# Patient Record
Sex: Male | Born: 1950 | Hispanic: No | Marital: Married | Smoking: Current some day smoker
Health system: Southern US, Community
[De-identification: ages and names within clinical notes are randomized; demographics above are authoritative.]

## PROBLEM LIST (undated history)

## (undated) DIAGNOSIS — E785 Hyperlipidemia, unspecified: Secondary | ICD-10-CM

## (undated) DIAGNOSIS — M109 Gout, unspecified: Secondary | ICD-10-CM

## (undated) DIAGNOSIS — I10 Essential (primary) hypertension: Secondary | ICD-10-CM

## (undated) DIAGNOSIS — M1990 Unspecified osteoarthritis, unspecified site: Secondary | ICD-10-CM

## (undated) DIAGNOSIS — D649 Anemia, unspecified: Secondary | ICD-10-CM

## (undated) HISTORY — DX: Hyperlipidemia, unspecified: E78.5

## (undated) HISTORY — PX: CIRCUMCISION: SUR203

---
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
## 2006-09-23 ENCOUNTER — Ambulatory Visit (HOSPITAL_COMMUNITY): Admission: RE | Admit: 2006-09-23 | Discharge: 2006-09-23 | Payer: Self-pay | Admitting: Family Medicine

## 2014-10-26 ENCOUNTER — Encounter (INDEPENDENT_AMBULATORY_CARE_PROVIDER_SITE_OTHER): Payer: Self-pay

## 2014-10-26 ENCOUNTER — Ambulatory Visit (HOSPITAL_COMMUNITY)
Admission: RE | Admit: 2014-10-26 | Discharge: 2014-10-26 | Disposition: A | Discharge status: Home / Self Care | Payer: BC Managed Care – PPO | Source: Ambulatory Visit | Attending: Family Medicine | Admitting: Family Medicine

## 2014-10-26 ENCOUNTER — Other Ambulatory Visit (HOSPITAL_COMMUNITY): Payer: Self-pay | Admitting: Family Medicine

## 2014-10-26 DIAGNOSIS — W06XXXA Fall from bed, initial encounter: Secondary | ICD-10-CM | POA: Diagnosis not present

## 2014-10-26 DIAGNOSIS — W19XXXA Unspecified fall, initial encounter: Secondary | ICD-10-CM

## 2014-10-26 DIAGNOSIS — M13842 Other specified arthritis, left hand: Secondary | ICD-10-CM | POA: Insufficient documentation

## 2014-10-26 DIAGNOSIS — M25532 Pain in left wrist: Secondary | ICD-10-CM | POA: Diagnosis not present

## 2014-10-26 DIAGNOSIS — T1490XA Injury, unspecified, initial encounter: Secondary | ICD-10-CM

## 2014-10-26 DIAGNOSIS — M7989 Other specified soft tissue disorders: Secondary | ICD-10-CM | POA: Diagnosis present

## 2014-10-26 DIAGNOSIS — M79642 Pain in left hand: Secondary | ICD-10-CM | POA: Diagnosis present

## 2015-05-04 ENCOUNTER — Encounter (HOSPITAL_COMMUNITY): Payer: Self-pay | Admitting: Cardiology

## 2015-05-04 ENCOUNTER — Emergency Department (HOSPITAL_COMMUNITY): Payer: 59

## 2015-05-04 ENCOUNTER — Emergency Department (HOSPITAL_COMMUNITY)
Admission: EM | Admit: 2015-05-04 | Discharge: 2015-05-04 | Disposition: A | Discharge status: Home / Self Care | Payer: 59 | Attending: Emergency Medicine | Admitting: Emergency Medicine

## 2015-05-04 DIAGNOSIS — I10 Essential (primary) hypertension: Secondary | ICD-10-CM | POA: Insufficient documentation

## 2015-05-04 DIAGNOSIS — M545 Low back pain: Secondary | ICD-10-CM | POA: Diagnosis present

## 2015-05-04 DIAGNOSIS — M62830 Muscle spasm of back: Secondary | ICD-10-CM | POA: Diagnosis not present

## 2015-05-04 HISTORY — DX: Essential (primary) hypertension: I10

## 2015-05-04 HISTORY — DX: Gout, unspecified: M10.9

## 2015-05-04 MED ORDER — KETOROLAC TROMETHAMINE 30 MG/ML IJ SOLN
30.0000 mg | Freq: Once | INTRAMUSCULAR | Status: AC
  Administered 2015-05-04: 30 mg via INTRAVENOUS
  Filled 2015-05-04: qty 1

## 2015-05-04 MED ORDER — METHOCARBAMOL 500 MG PO TABS: 500.0000 mg | ORAL_TABLET | Freq: Two times a day (BID) | ORAL | Status: DC

## 2015-05-04 MED ORDER — DIAZEPAM 5 MG/ML IJ SOLN
5.0000 mg | Freq: Once | INTRAMUSCULAR | Status: AC
  Administered 2015-05-04: 5 mg via INTRAVENOUS
  Filled 2015-05-04: qty 2

## 2015-05-04 MED ORDER — ONDANSETRON HCL 4 MG/2ML IJ SOLN
4.0000 mg | Freq: Once | INTRAMUSCULAR | Status: AC
  Administered 2015-05-04: 4 mg via INTRAVENOUS
  Filled 2015-05-04: qty 2

## 2015-05-04 MED ORDER — MORPHINE SULFATE 4 MG/ML IJ SOLN
4.0000 mg | Freq: Once | INTRAMUSCULAR | Status: AC
  Administered 2015-05-04: 4 mg via INTRAVENOUS
  Filled 2015-05-04: qty 1

## 2015-05-04 MED ORDER — HYDROCODONE-ACETAMINOPHEN 5-325 MG PO TABS: 1.0000 | ORAL_TABLET | ORAL | Status: DC | PRN

## 2015-05-04 NOTE — Discharge Instructions (Signed)
Take Vicodin as needed for pain. Take Robaxin as needed for muscle spasm. You may take these medications together. Refer to attached documents for more information.

## 2015-05-04 NOTE — ED Notes (Signed)
Pt returned from X-ray.  

## 2015-05-04 NOTE — ED Provider Notes (Signed)
CSN: [id number]     Arrival date & time 05/04/15  1707 History  This chart was scribed for non-physician practitioner, Alvina Chou, PA-C working with Orpah Greek, MD by Evelene Croon, ED Scribe. This patient was seen in room TR07C/TR07C and the patient's care was started at 6:54 PM.    Chief Complaint  Patient presents with  . Back Pain   The history is provided by the patient. No language interpreter was used.     HPI Comments:  Dustin Brown is a 64 y.o. male with a history of back pain who presents to the Emergency Department complaining of lower back pain since 0000 this am. Pt notes pain started after a bumpy RV ride last night; states he first noticed the pain when going up the stairs.His pain is worse this am with movement of any kind  He denies acute injury.  He denies pain with palpation of the lower back, fever, diaphoresis, chills, urinary/ bowel incontinence. No alleviating factors noted.No treatments tried PTA   Past Medical History  Diagnosis Date  . Hypertension   . Gout    History reviewed. No pertinent past surgical history. History reviewed. No pertinent family history. History  Substance Use Topics  . Smoking status: Never Smoker   . Smokeless tobacco: Not on file  . Alcohol Use: Yes    Review of Systems  Musculoskeletal: Positive for back pain.  All other systems reviewed and are negative.     Allergies  Review of patient's allergies indicates no known allergies.  Home Medications   Prior to Admission medications   Not on File   BP 167/80 mmHg  Pulse 83  Temp(Src) 98.1 F (36.7 C) (Oral)  Resp 16  Ht 5\' 6"  (1.676 m)  Wt 195 lb (88.451 kg)  BMI 31.49 kg/m2  SpO2 98% Physical Exam  Constitutional: He is oriented to person, place, and time. He appears well-developed and well-nourished. No distress.  HENT:  Head: Normocephalic and atraumatic.  Eyes: Conjunctivae and EOM are normal.  Neck: Normal range of motion.  Cardiovascular:  Normal rate and regular rhythm.  Exam reveals no gallop and no friction rub.   No murmur heard. Pulmonary/Chest: Effort normal and breath sounds normal. He has no wheezes. He has no rales. He exhibits no tenderness.  Abdominal: Soft. He exhibits no distension. There is no tenderness.  Musculoskeletal: Normal range of motion.  Lower lumbar and sacral tenderness to palpation with associated paraspinal tenderness to palpation.   Neurological: He is alert and oriented to person, place, and time.  Speech is goal-oriented. Moves limbs without ataxia.   Skin: Skin is warm and dry.  Psychiatric: He has a normal mood and affect. His behavior is normal.  Nursing note and vitals reviewed.   ED Course  Procedures   DIAGNOSTIC STUDIES:  Oxygen Saturation is 98% on RA, normal by my interpretation.    COORDINATION OF CARE:  7:51 PM Pt updated with results. Discussed treatment plan with pt at bedside and pt agreed to plan.  Labs Review Labs Reviewed - No data to display  Imaging Review Dg Lumbar Spine Complete  05/04/2015   CLINICAL DATA:  Bumpy ride in an RV yesterday.  Low back pain.  EXAM: LUMBAR SPINE - COMPLETE 4+ VIEW  COMPARISON:  None.  FINDINGS: There is no evidence of lumbar spine fracture. Alignment is normal. Chronic disc space narrowing L4-5 and L5-S1. Prominent anterior osteophytes.  IMPRESSION: Negative. Please see additional findings on sacrum and coccyx report.  Electronically Signed   By: Rolla Flatten M.D.   On: 05/04/2015 19:25   Dg Sacrum/coccyx  05/04/2015   CLINICAL DATA:  Sacral pain.  EXAM: SACRUM AND COCCYX - 2+ VIEW  COMPARISON:  None.  FINDINGS: No fracture or bony lesion is identified. Sacroiliac joints have a normal and symmetric appearance. The coccyx shows normal alignment. Other visualized portions of the bony pelvis are unremarkable.  IMPRESSION: No evidence of sacral or coccygeal fracture.   Electronically Signed   By: Aletta Edouard M.D.   On: 05/04/2015 19:27      EKG Interpretation None      MDM   Final diagnoses:  Spasm of lumbar paraspinous muscle    Patient reports improvement of symptoms. Patient's xrays unremarkable for acute changes. Patient likely having severe muscle spasm. No bladder/bowel incontinence or saddle paresthesias.   I personally performed the services described in this documentation, which was scribed in my presence. The recorded information has been reviewed and is accurate.    Alvina Chou, PA-C 05/04/15 [place], MD 05/05/15 [phone]

## 2015-05-04 NOTE — ED Notes (Signed)
Pt reports that he was in an RV last night on the way home and it bumpy. Reports he has been having lower back pain since he woke up this morning.

## 2016-01-23 ENCOUNTER — Other Ambulatory Visit: Payer: Self-pay | Admitting: Urology

## 2016-01-23 DIAGNOSIS — R9349 Abnormal radiologic findings on diagnostic imaging of other urinary organs: Secondary | ICD-10-CM

## 2016-02-05 ENCOUNTER — Ambulatory Visit (HOSPITAL_COMMUNITY)
Admission: RE | Admit: 2016-02-05 | Discharge: 2016-02-05 | Disposition: A | Discharge status: Home / Self Care | Payer: BLUE CROSS/BLUE SHIELD | Source: Ambulatory Visit | Attending: Urology | Admitting: Urology

## 2016-02-05 DIAGNOSIS — N289 Disorder of kidney and ureter, unspecified: Secondary | ICD-10-CM | POA: Diagnosis not present

## 2016-02-05 DIAGNOSIS — K5730 Diverticulosis of large intestine without perforation or abscess without bleeding: Secondary | ICD-10-CM | POA: Insufficient documentation

## 2016-02-05 DIAGNOSIS — R9349 Abnormal radiologic findings on diagnostic imaging of other urinary organs: Secondary | ICD-10-CM

## 2016-02-05 LAB — POCT I-STAT CREATININE: Creatinine, Ser: 0.9 mg/dL (ref 0.61–1.24)

## 2016-02-05 MED ORDER — GADOBENATE DIMEGLUMINE 529 MG/ML IV SOLN
20.0000 mL | Freq: Once | INTRAVENOUS | Status: AC | PRN
  Administered 2016-02-05: 18 mL via INTRAVENOUS

## 2017-08-08 ENCOUNTER — Other Ambulatory Visit: Payer: Self-pay | Admitting: Urology

## 2017-08-08 DIAGNOSIS — D49519 Neoplasm of unspecified behavior of unspecified kidney: Secondary | ICD-10-CM

## 2017-09-15 ENCOUNTER — Ambulatory Visit (HOSPITAL_COMMUNITY)
Admission: RE | Admit: 2017-09-15 | Discharge: 2017-09-15 | Disposition: A | Discharge status: Home / Self Care | Payer: BLUE CROSS/BLUE SHIELD | Source: Ambulatory Visit | Attending: Urology | Admitting: Urology

## 2017-09-15 DIAGNOSIS — Q6102 Congenital multiple renal cysts: Secondary | ICD-10-CM | POA: Insufficient documentation

## 2017-09-15 DIAGNOSIS — D3000 Benign neoplasm of unspecified kidney: Secondary | ICD-10-CM | POA: Insufficient documentation

## 2017-09-15 DIAGNOSIS — D49519 Neoplasm of unspecified behavior of unspecified kidney: Secondary | ICD-10-CM

## 2017-09-15 LAB — POCT I-STAT CREATININE: CREATININE: 0.9 mg/dL (ref 0.61–1.24)

## 2017-09-15 MED ORDER — GADOBENATE DIMEGLUMINE 529 MG/ML IV SOLN
20.0000 mL | Freq: Once | INTRAVENOUS | Status: AC | PRN
  Administered 2017-09-15: 18 mL via INTRAVENOUS

## 2018-06-03 DIAGNOSIS — Z6830 Body mass index (BMI) 30.0-30.9, adult: Secondary | ICD-10-CM | POA: Diagnosis not present

## 2018-06-03 DIAGNOSIS — M1000 Idiopathic gout, unspecified site: Secondary | ICD-10-CM | POA: Diagnosis not present

## 2018-06-03 DIAGNOSIS — M130 Polyarthritis, unspecified: Secondary | ICD-10-CM | POA: Diagnosis not present

## 2018-06-18 DIAGNOSIS — R9431 Abnormal electrocardiogram [ECG] [EKG]: Secondary | ICD-10-CM | POA: Diagnosis not present

## 2018-06-18 DIAGNOSIS — I10 Essential (primary) hypertension: Secondary | ICD-10-CM | POA: Diagnosis not present

## 2018-06-22 DIAGNOSIS — R0683 Snoring: Secondary | ICD-10-CM | POA: Diagnosis not present

## 2018-06-22 DIAGNOSIS — R9431 Abnormal electrocardiogram [ECG] [EKG]: Secondary | ICD-10-CM | POA: Diagnosis not present

## 2018-06-22 DIAGNOSIS — Z8249 Family history of ischemic heart disease and other diseases of the circulatory system: Secondary | ICD-10-CM | POA: Diagnosis not present

## 2018-06-22 DIAGNOSIS — I119 Hypertensive heart disease without heart failure: Secondary | ICD-10-CM | POA: Diagnosis not present

## 2018-06-22 DIAGNOSIS — M1000 Idiopathic gout, unspecified site: Secondary | ICD-10-CM | POA: Diagnosis not present

## 2018-06-22 DIAGNOSIS — I38 Endocarditis, valve unspecified: Secondary | ICD-10-CM | POA: Diagnosis not present

## 2018-06-22 DIAGNOSIS — Z79899 Other long term (current) drug therapy: Secondary | ICD-10-CM | POA: Diagnosis not present

## 2018-06-22 DIAGNOSIS — I5189 Other ill-defined heart diseases: Secondary | ICD-10-CM | POA: Diagnosis not present

## 2018-06-22 DIAGNOSIS — R972 Elevated prostate specific antigen [PSA]: Secondary | ICD-10-CM | POA: Diagnosis not present

## 2018-06-22 DIAGNOSIS — I10 Essential (primary) hypertension: Secondary | ICD-10-CM | POA: Diagnosis not present

## 2018-07-21 DIAGNOSIS — M1000 Idiopathic gout, unspecified site: Secondary | ICD-10-CM | POA: Diagnosis not present

## 2018-07-21 DIAGNOSIS — I10 Essential (primary) hypertension: Secondary | ICD-10-CM | POA: Diagnosis not present

## 2018-07-21 DIAGNOSIS — E782 Mixed hyperlipidemia: Secondary | ICD-10-CM | POA: Diagnosis not present

## 2018-07-23 ENCOUNTER — Ambulatory Visit (INDEPENDENT_AMBULATORY_CARE_PROVIDER_SITE_OTHER): Payer: Medicare Other | Admitting: Neurology

## 2018-07-23 ENCOUNTER — Encounter: Payer: Self-pay | Admitting: Neurology

## 2018-07-23 VITALS — BP 154/82 | HR 61 | Ht 66.0 in | Wt 188.0 lb

## 2018-07-23 DIAGNOSIS — G4719 Other hypersomnia: Secondary | ICD-10-CM | POA: Diagnosis not present

## 2018-07-23 DIAGNOSIS — R0683 Snoring: Secondary | ICD-10-CM

## 2018-07-23 DIAGNOSIS — E669 Obesity, unspecified: Secondary | ICD-10-CM

## 2018-07-23 DIAGNOSIS — R011 Cardiac murmur, unspecified: Secondary | ICD-10-CM | POA: Diagnosis not present

## 2018-07-23 DIAGNOSIS — R9431 Abnormal electrocardiogram [ECG] [EKG]: Secondary | ICD-10-CM | POA: Diagnosis not present

## 2018-07-23 NOTE — Patient Instructions (Signed)

## 2018-07-23 NOTE — Progress Notes (Signed)
Subjective:    Patient ID: Dustin Brown is a 67 y.o. male.  HPI     Star Age, MD, PhD [facility] [address], [address] [place], [place] [address]  Dear Dr. Virgina Jock,   I saw your patient, Dustin Brown, upon your kind request, in my neurologic clinic today for initial consultation of his sleep disorder, in particular, concern for underlying obstructive sleep apnea. The patient is unaccompanied today. As you know, Dustin Brown is a 67 year old right-handed gentleman with an underlying medical history of hypertension, hyperlipidemia, gout, low testosterone, smoking, iron deficiency, and obesity, who reports snoring and excessive daytime somnolence. I reviewed your office note from 05/20/2018, which you kindly included. He had evidence of EKG abnormalities. He was scheduled for further workup including echocardiogram. His Epworth sleepiness score is 10 out of 24 today, fatigue score is 17 out of 63. He is married and lives with his wife, they have 3 children. He works as a Art gallery manager. He smokes cigars occasionally. He drinks alcohol on a regular basis, in the form of Brandy, 4-5 per night. He drinks caffeine in the form of coffee or tea occasionally. His bedtime is generally between 10 and 10:30, rise time between 6 and 6:30 AM. He does not have a night to night nocturia and denies morning headaches. Generally he wakes up fairly well rested, sometimes he is tired during the day and may take a nap. He has never fallen asleep at the wheel. His snoring sometimes bothers his wife. He is not aware of any family history of OSA. He would be willing to try CPAP if needed. He reports that his echocardiogram was fine. He smokes a cigar a week on average. He does not have a history of lung disease or asthma in the past.   His Past Medical History Is Significant For: Past Medical History:  Diagnosis Date  . Gout   . Hyperlipidemia   . Hypertension     His Past Surgical  History Is Significant For: No past surgical history on file.  His Family History Is Significant For: Family History  Problem Relation Age of Onset  . Heart attack Father     His Social History Is Significant For: Social History   Socioeconomic History  . Marital status: Married    Spouse name: Not on file  . Number of children: Not on file  . Years of education: Not on file  . Highest education level: Not on file  Occupational History  . Not on file  Social Needs  . Financial resource strain: Not on file  . Food insecurity:    Worry: Not on file    Inability: Not on file  . Transportation needs:    Medical: Not on file    Non-medical: Not on file  Tobacco Use  . Smoking status: Current Some Day Smoker  . Smokeless tobacco: Never Used  Substance and Sexual Activity  . Alcohol use: Yes  . Drug use: Not on file  . Sexual activity: Not on file  Lifestyle  . Physical activity:    Days per week: Not on file    Minutes per session: Not on file  . Stress: Not on file  Relationships  . Social connections:    Talks on phone: Not on file    Gets together: Not on file    Attends religious service: Not on file    Active member of club or organization: Not on file    Attends  meetings of clubs or organizations: Not on file    Relationship status: Not on file  Other Topics Concern  . Not on file  Social History Narrative  . Not on file    His Allergies Are:  No Known Allergies:   His Current Medications Are:  Outpatient Encounter Medications as of 07/23/2018  Medication Sig  . allopurinol (ZYLOPRIM) 100 MG tablet Take 100 mg by mouth daily.  Marland Kitchen amLODipine (NORVASC) 5 MG tablet Take 5 mg by mouth daily.  Marland Kitchen aspirin EC 81 MG tablet Take 81 mg by mouth daily.  . Cholecalciferol (VITAMIN D3 PO) Take by mouth.  . Ferrous Sulfate (IRON) 325 (65 Fe) MG TABS Take by mouth.  . hydrochlorothiazide (HYDRODIURIL) 25 MG tablet Take 25 mg by mouth daily.  . Latanoprostene Bunod  (VYZULTA) 0.024 % SOLN Apply to eye.  . rosuvastatin (CRESTOR) 20 MG tablet Take 20 mg by mouth daily.  . Testosterone 20.25 MG/1.25GM (1.62%) GEL Place onto the skin.  . verapamil (CALAN-SR) 240 MG CR tablet Take 240 mg by mouth 2 (two) times daily.  . [DISCONTINUED] HYDROcodone-acetaminophen (NORCO/VICODIN) 5-325 MG per tablet Take 1-2 tablets by mouth every 4 (four) hours as needed.  . [DISCONTINUED] methocarbamol (ROBAXIN) 500 MG tablet Take 1 tablet (500 mg total) by mouth 2 (two) times daily.   No facility-administered encounter medications on file as of 07/23/2018.   :  Review of Systems:  Out of a complete 14 point review of systems, all are reviewed and negative with the exception of these symptoms as listed below:  Review of Systems  Neurological:       Pt presents today to discuss his sleep. Pt has never had a sleep study but does endorse snoring.  Epworth Sleepiness Scale 0= would never doze 1= slight chance of dozing 2= moderate chance of dozing 3= high chance of dozing  Sitting and reading: 2 Watching TV: 2 Sitting inactive in a public place (ex. Theater or meeting): 2 As a passenger in a car for an hour without a break: 1 Lying down to rest in the afternoon: 3 Sitting and talking to someone: 0 Sitting quietly after lunch (no alcohol): 0 In a car, while stopped in traffic: 0 Total: 10     Objective:  Neurological Exam  Physical Exam Physical Examination:   Vitals:   07/23/18 0850  BP: (!) 154/82  Pulse: 61   General Examination: The patient is a very pleasant 67 y.o. male in no acute distress. He appears well-developed and well-nourished and well groomed.   HEENT: Normocephalic, atraumatic, pupils are equal, round and reactive to light and accommodation. Funduscopic exam is not possible, has fairly dense cataracts. He has corrective eyeglasses in place. He has bilateral lazy eye, right eye seems dominant. Extraocular tracking is good without limitation to  gaze excursion or nystagmus noted. Normal smooth pursuit is noted. Hearing is grossly intact. Face is symmetric with normal facial animation and normal facial sensation. Speech is clear with no dysarthria noted. There is no hypophonia. There is no lip, neck/head, jaw or voice tremor. Neck is supple with full range of passive and active motion. There are no carotid bruits on auscultation. Oropharynx exam reveals: mild mouth dryness, adequate dental hygiene and full dentures in place and fairly significant airway crowding secondary to thicker soft palate, larger tongue, redundant soft palate tip of uvula was not fully visualized and tonsils of 1-2+ bilaterally. Mallampati is class III. Neck circumference is 16-1/4 inches.  Chest: Clear  to auscultation without wheezing, rhonchi or crackles noted.  Heart: S1+S2+0, regular and normal with a 2/6 syst murmur, no rubs or gallops noted.   Abdomen: Soft, non-tender and non-distended with normal bowel sounds appreciated on auscultation.  Extremities: There is no pitting edema in the distal lower extremities bilaterally. Pedal pulses are intact.  Skin: Warm and dry without trophic changes noted.  Musculoskeletal: exam reveals no obvious joint deformities, tenderness or joint swelling or erythema.   Neurologically:  Mental status: The patient is awake, alert and oriented in all 4 spheres. His immediate and remote memory, attention, language skills and fund of knowledge are appropriate. There is no evidence of aphasia, agnosia, apraxia or anomia. Speech is clear with normal prosody and enunciation. Thought process is linear. Mood is normal and affect is normal.  Cranial nerves II - XII are as described above under HEENT exam. In addition: shoulder shrug is normal with equal shoulder height noted. Motor exam: Normal bulk, strength and tone is noted. There is no drift, tremor or rebound. Romberg is negative. Reflexes are 1+ throughout. Fine motor skills and  coordination: intact with normal finger taps, normal hand movements, normal rapid alternating patting, normal foot taps and normal foot agility.  Cerebellar testing: No dysmetria or intention tremor. There is no truncal or gait ataxia.  Sensory exam: intact to light touch in the upper and lower extremities.  Gait, station and balance: He stands easily. No veering to one side is noted. No leaning to one side is noted. Posture is age-appropriateAnd he stands naturally slightly wider based. Tandem walk is not possible for him. Gait is otherwise unremarkable with preserved arm swing.  Assessment and Plan:   In summary, Quention Mcneill is a very pleasant 67 y.o.-year old male with an underlying medical history of hypertension, hyperlipidemia, gout, low testosterone, smoking, iron deficiency, and obesity, whose history and particularly, his physical exam are rightconcerning for obstructive sleep apnea (OSA). I had a long chat with the patient about my findings and the diagnosis of OSA, its prognosis and treatment options. We talked about medical treatments, surgical interventions and non-pharmacological approaches. I explained in particular the risks and ramifications of untreated moderate to severe OSA, especially with respect to developing cardiovascular disease down the Road, including congestive heart failure, difficult to treat hypertension, cardiac arrhythmias, or stroke. Even type 2 diabetes has, in part, been linked to untreated OSA. Symptoms of untreated OSA include daytime sleepiness, memory problems, mood irritability and mood disorder such as depression and anxiety, lack of energy, as well as recurrent headaches, especially morning headaches. We talked about smoking cessation and trying to maintain a healthy lifestyle in general, as well as the importance of weight control. I encouraged the patient to eat healthy, exercise daily and keep well hydrated, to keep a scheduled bedtime and wake time routine, to  not skip any meals and eat healthy snacks in between meals. I advised the patient not to drive when feeling sleepy. I recommended the following at this time: sleep study with potential positive airway pressure titration. (We will score hypopneas at 4%).   I explained the sleep test procedure to the patient and also outlined possible treatment options including the use of CPAP therapy. He indicated that he would be willing to try CPAP if the need arises.  I answered all his questions today and the patient was in agreement. I would like to see him back after the sleep study is completed and encouraged him to call with any interim  questions, concerns, problems or updates.   Thank you very much for allowing me to participate in the care of this nice patient. If I can be of any further assistance to you please do not hesitate to call me at [phone].  Sincerely,   Star Age, MD, PhD

## 2018-08-02 ENCOUNTER — Ambulatory Visit (INDEPENDENT_AMBULATORY_CARE_PROVIDER_SITE_OTHER): Payer: Medicare Other | Admitting: Neurology

## 2018-08-02 DIAGNOSIS — G4734 Idiopathic sleep related nonobstructive alveolar hypoventilation: Secondary | ICD-10-CM

## 2018-08-02 DIAGNOSIS — R011 Cardiac murmur, unspecified: Secondary | ICD-10-CM

## 2018-08-02 DIAGNOSIS — G4719 Other hypersomnia: Secondary | ICD-10-CM

## 2018-08-02 DIAGNOSIS — G4733 Obstructive sleep apnea (adult) (pediatric): Secondary | ICD-10-CM | POA: Diagnosis not present

## 2018-08-02 DIAGNOSIS — R0683 Snoring: Secondary | ICD-10-CM

## 2018-08-02 DIAGNOSIS — G4720 Circadian rhythm sleep disorder, unspecified type: Secondary | ICD-10-CM

## 2018-08-02 DIAGNOSIS — R9431 Abnormal electrocardiogram [ECG] [EKG]: Secondary | ICD-10-CM

## 2018-08-02 DIAGNOSIS — E669 Obesity, unspecified: Secondary | ICD-10-CM

## 2018-08-04 ENCOUNTER — Telehealth: Payer: Self-pay

## 2018-08-04 NOTE — Telephone Encounter (Signed)
I called pt. I advised pt that Dr. Rexene Alberts reviewed their sleep study results and found that pt has severe osa with low oxygen saturations and recommends that pt be treated with a cpap. Dr. Rexene Alberts recommends that pt return for a repeat sleep study in order to properly titrate the cpap and ensure a good mask fit. Pt is agreeable to returning for a titration study. I advised pt that our sleep lab will file with pt's insurance and call pt to schedule the sleep study when we hear back from the pt's insurance regarding coverage of this sleep study. I recommended that pt lose some weight and stop smoking altogether. Pt verbalized understanding of results. Pt had no questions at this time but was encouraged to call back if questions arise.

## 2018-08-04 NOTE — Procedures (Signed)
PATIENT'S NAME:  Dustin Brown, Dustin Brown DOB:      05-Jan-1951      MR#:    [id number]     DATE OF RECORDING: 08/02/2018 REFERRING M.D.:  Vernell Leep, MD Study Performed:   Baseline Polysomnogram HISTORY: 67 year old right-handed gentleman with an underlying medical history of hypertension, hyperlipidemia, gout, low testosterone, smoking, iron deficiency, and obesity, who reports snoring and excessive daytime somnolence. He had evidence of EKG abnormalities. The patient endorsed the Epworth Sleepiness Scale at 10/24 points. The patient's weight 188 pounds with a height of 66 (inches), resulting in a BMI of 30.1 kg/m2. The patient's neck circumference measured 16.2 inches.  CURRENT MEDICATIONS: Allopurinol, Amlodipine, Aspirin, Ferrous Sulfate, Hydrochlorothiazide, Latanoprostene Bunod, Rosuvastain, Testosterone and Verapamil.   PROCEDURE:  This is a multichannel digital polysomnogram utilizing the Somnostar 11.2 system.  Electrodes and sensors were applied and monitored per AASM Specifications.   EEG, EOG, Chin and Limb EMG, were sampled at 200 Hz.  ECG, Snore and Nasal Pressure, Thermal Airflow, Respiratory Effort, CPAP Flow and Pressure, Oximetry was sampled at 50 Hz. Digital video and audio were recorded.      BASELINE STUDY  Lights Out was at 21:56 and Lights On at 05:13.  Total recording time (TRT) was 437.5 minutes, with a total sleep time (TST) of  329 minutes.   The patient's sleep latency was 3 minutes. REM latency was 136 minutes, which is mildly delayed. The sleep efficiency was 75.2%.      SLEEP ARCHITECTURE: WASO (Wake after sleep onset) was 104.5 minutes with one long period of wakefulness and otherwise no significant sleep fragmentation noted. There were 13.5 minutes in Stage N1, 263.5 minutes Stage N2, 0 minutes Stage N3 and 52 minutes in Stage REM. The percentage of Stage N1 was 4.1%, Stage N2 was 80.1%, which is markedly increased, stage N3 was absent and Stage R (REM sleep) was 15.8%, which  is mildly reduced. The arousals were noted as: 20 were spontaneous, 0 were associated with PLMs, 200 were associated with respiratory events.  RESPIRATORY ANALYSIS:  There were a total of 202 respiratory events:  62 obstructive apneas, 0 central apneas and 0 mixed apneas with a total of 62 apneas and an apnea index (AI) of 11.3 /hour. There were 140 hypopneas with a hypopnea index of 25.5 /hour. The patient also had 0 respiratory event related arousals (RERAs).      The total APNEA/HYPOPNEA INDEX (AHI) was 36.8/hour and the total RESPIRATORY DISTURBANCE INDEX was  36.8 /hour.  63 events occurred in REM sleep and 218 events in NREM. The REM AHI was 72.7 /hour, versus a non-REM AHI of 30.1. The patient spent 72 minutes of total sleep time in the supine position and 257 minutes in non-supine.. The supine AHI was 45.9 versus a non-supine AHI of 34.3.  OXYGEN SATURATION & C02:  The Wake baseline 02 saturation was 94%, with the lowest being 74%. Time spent below 89% saturation equaled 246 minutes.  PERIODIC LIMB MOVEMENTS: The patient had a total of 0 Periodic Limb Movements.  The Periodic Limb Movement (PLM) index was 0 and the PLM Arousal index was 0/hour.  Audio and video analysis did not show any abnormal or unusual movements, behaviors, phonations or vocalizations. The patient took on bathroom break. Mild snoring was noted. The EKG was in keeping with normal sinus rhythm (NSR).  Post-study, the patient indicated that sleep was the same as usual.   IMPRESSION: 1. Obstructive Sleep Apnea (OSA)\ 2. Nocturnal hypoxemia 3. Dysfunctions associated  with sleep stages or arousal from sleep  RECOMMENDATIONS: 1. This study demonstrates severe obstructive sleep apnea, with a total AHI of 36.8/hour, REM AHI of 72.74/hour, supine AHI of 45.9/hour and O2 nadir of 74%. His asleep average oxygen saturation was less than 90% indicating nocturnal hypoxia with significant time below or at 88% saturation of over 4  hours for the night. Treatment with positive airway pressure in the form of CPAP is recommended. This will require a full night titration study to optimize therapy. He may need further cardiopulmonary workup for low oxygen saturations during sleep. Weight loss and (cigar) smoking cessation should be promoted. Other treatment options for OSA may include avoidance of supine sleep position along with weight loss, upper airway or jaw surgery in selected patients or the use of an oral appliance in certain patients. ENT evaluation and/or consultation with a maxillofacial surgeon or dentist may be feasible in some instances. Please note that untreated obstructive sleep apnea may carry additional perioperative morbidity. Patients with significant obstructive sleep apnea should receive perioperative PAP therapy and the surgeons and particularly the anesthesiologist should be informed of the diagnosis and the severity of the sleep disordered breathing. 2. This study shows abnormal sleep stage percentages; these are nonspecific findings and per se do not signify an intrinsic sleep disorder or a cause for the patient's sleep-related symptoms. Causes include (but are not limited to) the first night effect of the sleep study, circadian rhythm disturbances, medication effect or an underlying mood disorder or medical problem.  3. The patient should be cautioned not to drive, work at heights, or operate dangerous or heavy equipment when tired or sleepy. Review and reiteration of good sleep hygiene measures should be pursued with any patient. 4. The patient will be seen in follow-up in the sleep clinic at [facility] for discussion of the test results, symptom and treatment compliance review, further management strategies, etc. The referring provider will be notified of the test results.  I certify that I have reviewed the entire raw data recording prior to the issuance of this report in accordance with the Standards of Accreditation of  the American Academy of Sleep Medicine (AASM)  Star Age, MD, PhD Diplomat, American Board of Neurology and Sleep Medicine (Neurology and Sleep Medicine)

## 2018-08-04 NOTE — Telephone Encounter (Signed)
-----   Message from Star Age, MD sent at 08/04/2018  8:37 AM EDT ----- Patient referred by Dr. Virgina Jock, seen by me on 07/23/18, diagnostic PSG on 08/02/18.   Please call and notify the patient that the recent sleep study showed severe obstructive sleep apnea, with a total AHI of 36.8/hour, REM AHI of 72.74/hour, supine AHI of 45.9/hour and O2 nadir of 74%. His asleep average oxygen saturation was less than 90% indicating nocturnal hypoxia with significant time below or at 88% saturation of over 4 hours for the night. I recommend treatment for this in the form of CPAP. This will require a repeat sleep study for proper titration and mask fitting and correct monitoring of the oxygen saturations. Furthermore, I encourage pt to lose weight and quit (cigar) smoking altogether. Please explain to patient. I have placed an order in the chart. Thanks.  Star Age, MD, PhD [facility])

## 2018-08-04 NOTE — Progress Notes (Signed)
Patient referred by Dr. Virgina Jock, seen by me on 07/23/18, diagnostic PSG on 08/02/18.   Please call and notify the patient that the recent sleep study showed severe obstructive sleep apnea, with a total AHI of 36.8/hour, REM AHI of 72.74/hour, supine AHI of 45.9/hour and O2 nadir of 74%. His asleep average oxygen saturation was less than 90% indicating nocturnal hypoxia with significant time below or at 88% saturation of over 4 hours for the night. I recommend treatment for this in the form of CPAP. This will require a repeat sleep study for proper titration and mask fitting and correct monitoring of the oxygen saturations. Furthermore, I encourage pt to lose weight and quit (cigar) smoking altogether. Please explain to patient. I have placed an order in the chart. Thanks.  Star Age, MD, PhD [facility])

## 2018-08-04 NOTE — Addendum Note (Signed)
Addended by: Star Age on: 08/04/2018 08:37 AM   Modules accepted: Orders

## 2018-08-16 ENCOUNTER — Ambulatory Visit (INDEPENDENT_AMBULATORY_CARE_PROVIDER_SITE_OTHER): Payer: Medicare Other | Admitting: Neurology

## 2018-08-16 DIAGNOSIS — G4720 Circadian rhythm sleep disorder, unspecified type: Secondary | ICD-10-CM

## 2018-08-16 DIAGNOSIS — G4733 Obstructive sleep apnea (adult) (pediatric): Secondary | ICD-10-CM | POA: Diagnosis not present

## 2018-08-16 DIAGNOSIS — R9431 Abnormal electrocardiogram [ECG] [EKG]: Secondary | ICD-10-CM

## 2018-08-16 DIAGNOSIS — E669 Obesity, unspecified: Secondary | ICD-10-CM

## 2018-08-16 DIAGNOSIS — G4734 Idiopathic sleep related nonobstructive alveolar hypoventilation: Secondary | ICD-10-CM

## 2018-08-16 DIAGNOSIS — G4719 Other hypersomnia: Secondary | ICD-10-CM

## 2018-08-16 DIAGNOSIS — R011 Cardiac murmur, unspecified: Secondary | ICD-10-CM

## 2018-08-19 ENCOUNTER — Telehealth: Payer: Self-pay

## 2018-08-19 NOTE — Telephone Encounter (Signed)
I called pt. I advised pt that Dr. Rexene Alberts reviewed their sleep study results and found that pt did well during his latest sleep study with the cpap. Dr. Rexene Alberts recommends that pt start a cpap at home. I reviewed PAP compliance expectations with the pt. Pt is agreeable to starting a CPAP. I advised pt that an order will be sent to a DME, [facility], and [facility] will call the pt within about one week after they file with the pt's insurance. [facility] will show the pt how to use the machine, fit for masks, and troubleshoot the CPAP if needed. A follow up appt was made for insurance purposes with Dr. Rexene Alberts on 11/09/2018 at 8:30am. Pt verbalized understanding to arrive 15 minutes early and bring their CPAP. A letter with all of this information in it will be mailed to the pt as a reminder. I verified with the pt that the address we have on file is correct. Pt verbalized understanding of results. Pt had no questions at this time but was encouraged to call back if questions arise.

## 2018-08-19 NOTE — Procedures (Signed)
PATIENT'S NAME:  Dustin Brown, Dustin Brown DOB:      05-14-1951      MR#:    [id number]     DATE OF RECORDING: 08/16/2018 REFERRING M.D.:  Vernell Leep, MD Study Performed:   CPAP  Titration HISTORY: 67 year old right-handed gentleman with an underlying medical history of hypertension, hyperlipidemia, gout, low testosterone, smoking, iron deficiency, and obesity, who presents for a full night titration study. His baseline PSG on 08/02/18 showed severe obstructive sleep apnea, with a total AHI of 36.8/hour, REM AHI of 72.74/hour, supine AHI of 45.9/hour and O2 nadir of 74%. His asleep average oxygen saturation was less than 90% indicating nocturnal hypoxia with significant time below or at 88% saturation of over 4 hours for the night. The patient's weight 187 pounds with a height of 66 (inches), resulting in a BMI of 30.1 kg/m2. The patient's neck circumference measured 16 inches.  CURRENT MEDICATIONS: Allopurinol, Amlodipine, Aspirin, Ferrous Sulfate, Hydrochlorothiazide, Latanoprostene Bunod, Rosuvastain, Testosterone and Verapamil.  PROCEDURE:  This is a multichannel digital polysomnogram utilizing the SomnoStar 11.2 system.  Electrodes and sensors were applied and monitored per AASM Specifications.   EEG, EOG, Chin and Limb EMG, were sampled at 200 Hz.  ECG, Snore and Nasal Pressure, Thermal Airflow, Respiratory Effort, CPAP Flow and Pressure, Oximetry was sampled at 50 Hz. Digital video and audio were recorded.      The patient was fitted with a MW Dreamwear FFM. CPAP was initiated at 5 cmH20 with heated humidity per AASM standards and pressure was advanced to 12 cmH20 because of hypopneas, apneas and desaturations.  At a PAP pressure of 12 cmH20, there was a reduction of the AHI to 0/hour with brief supine REM sleep achieved and O2 nadir of 89%.  Lights Out was at 22:00 and Lights On at 05:01. Total recording time (TRT) was 421.5 minutes, with a total sleep time (TST) of 304 minutes. The patient's sleep  latency was 15.5 minutes. REM latency was 374.5 minutes, which is markedly delayed. The sleep efficiency was 72.1 %, which is reduced.    SLEEP ARCHITECTURE: WASO (Wake after sleep onset) was 93.5 minutes with moderate sleep fragmentation noted.  There were 17.5 minutes in Stage N1, 264.5 minutes Stage N2, 0 minutes Stage N3 and 22 minutes in Stage REM.  The percentage of Stage N1 was 5.8%, Stage N2 was 87.%, Stage N3 was absent, and Stage R (REM sleep) was 7.2%, which is reduced. The arousals were noted as: 21 were spontaneous, 0 were associated with PLMs, 68 were associated with respiratory events.  RESPIRATORY ANALYSIS:  There was a total of 70 respiratory events: 0 obstructive apneas, 0 central apneas and 0 mixed apneas with a total of 0 apneas and an apnea index (AI) of 0 /hour. There were 70 hypopneas with a hypopnea index of 13.8/hour. The patient also had 0 respiratory event related arousals (RERAs).      The total APNEA/HYPOPNEA INDEX  (AHI) was 13.8 /hour and the total RESPIRATORY DISTURBANCE INDEX was 13.8 /hour  0 events occurred in REM sleep and 70 events in NREM. The REM AHI was 0 /hour versus a non-REM AHI of 14.9 /hour.  The patient spent 217.5 minutes of total sleep time in the supine position and 87 minutes in non-supine. The supine AHI was 15.7, versus a non-supine AHI of 9.0.  OXYGEN SATURATION & C02:  The baseline 02 saturation was 96%, with the lowest being 84%. Time spent below 89% saturation equaled 33 minutes.  PERIODIC LIMB MOVEMENTS:  The patient had a total of 0 Periodic Limb Movements. The Periodic Limb Movement (PLM) index was 0 and the PLM Arousal index was 0 /hour.  Audio and video analysis did not show any abnormal or unusual movements, behaviors, phonations or vocalizations. The patient took no bathroom breaks. The EKG was in keeping with normal sinus rhythm (NSR). Post-study, the patient indicated that sleep was better than usual.   IMPRESSION:  1. Obstructive  Sleep Apnea (OSA)\ 2. Dysfunctions associated with sleep stages or arousal from sleep   RECOMMENDATIONS:  1. This study demonstrates resolution of the patient's obstructive sleep apnea with CPAP therapy. I will recommend a home CPAP pressure of 13 cm, due to O2 nadir in supine REM sleep of 89% on the final titration pressure of 12 cm. The patient should be reminded to be fully compliant with PAP therapy to improve sleep related symptoms and decrease long term cardiovascular risks. The patient should be reminded, that it may take up to 3 months to get fully used to using PAP with all planned sleep. The earlier full compliance is achieved, the better long term compliance tends to be. Please note that untreated obstructive sleep apnea may carry additional perioperative morbidity. Patients with significant obstructive sleep apnea should receive perioperative PAP therapy and the surgeons and particularly the anesthesiologist should be informed of the diagnosis and the severity of the sleep disordered breathing. Weight loss and complete smoking cessation should be encouraged.  2. This study shows sleep fragmentation and abnormal sleep stage percentages; these are nonspecific findings and per se do not signify an intrinsic sleep disorder or a cause for the patient's sleep-related symptoms. Causes include (but are not limited to) the first night effect of the sleep study, circadian rhythm disturbances, medication effect or an underlying mood disorder or medical problem.  3. The patient should be cautioned not to drive, work at heights, or operate dangerous or heavy equipment when tired or sleepy. Review and reiteration of good sleep hygiene measures should be pursued with any patient. 4. The patient will be seen in follow-up in the sleep clinic at [facility] for discussion of the test results, symptom and treatment compliance review, further management strategies, etc. The referring provider will be notified of the test  results.   I certify that I have reviewed the entire raw data recording prior to the issuance of this report in accordance with the Standards of Accreditation of the American Academy of Sleep Medicine (AASM)   Star Age, MD, PhD Diplomat, American Board of Neurology and Sleep Medicine (Neurology and Sleep Medicine)

## 2018-08-19 NOTE — Telephone Encounter (Signed)
-----   Message from Star Age, MD sent at 08/19/2018  7:50 AM EDT ----- Patient referred by Dr. Virgina Jock, seen by me on 07/23/18, diagnostic PSG on 08/02/18. Patient had a CPAP titration study on 9/22/219.  Please call and inform patient that I have entered an order for treatment with positive airway pressure (PAP) treatment for obstructive sleep apnea (OSA). He did well during the latest sleep study with CPAP. We will, therefore, arrange for a machine for home use through a DME (durable medical equipment) company of His choice; and I will see the patient back in follow-up in about 10 weeks. Please also explain to the patient that I will be looking out for compliance data, which can be downloaded from the machine (stored on an SD card, that is inserted in the machine) or via remote access through a modem, that is built into the machine. At the time of the followup appointment we will discuss sleep study results and how it is going with PAP treatment at home. Please advise patient to bring His machine at the time of the first FU visit, even though this is cumbersome. Bringing the machine for every visit after that will likely not be needed, but often helps for the first visit to troubleshoot if needed. Please re-enforce the importance of compliance with treatment and the need for [place] to monitor compliance data - often an insurance requirement and actually good feedback for the patient as far as how they are doing.  Also remind patient, that any interim PAP machine or mask issues should be first addressed with the DME company, as they can often help better with technical and mask fit issues. Please ask if patient has a preference regarding DME company.  Please also make sure, the patient has a follow-up appointment with me in about 10 weeks from the setup date, thanks. May see one of our nurse practitioners if needed for proper timing of the FU appointment.  Please fax or rout report to the referring provider.  Thanks,   Star Age, MD, PhD [facility])

## 2018-08-19 NOTE — Progress Notes (Signed)
Patient referred by Dr. Virgina Jock, seen by me on 07/23/18, diagnostic PSG on 08/02/18. Patient had a CPAP titration study on 9/22/219.  Please call and inform patient that I have entered an order for treatment with positive airway pressure (PAP) treatment for obstructive sleep apnea (OSA). He did well during the latest sleep study with CPAP. We will, therefore, arrange for a machine for home use through a DME (durable medical equipment) company of His choice; and I will see the patient back in follow-up in about 10 weeks. Please also explain to the patient that I will be looking out for compliance data, which can be downloaded from the machine (stored on an SD card, that is inserted in the machine) or via remote access through a modem, that is built into the machine. At the time of the followup appointment we will discuss sleep study results and how it is going with PAP treatment at home. Please advise patient to bring His machine at the time of the first FU visit, even though this is cumbersome. Bringing the machine for every visit after that will likely not be needed, but often helps for the first visit to troubleshoot if needed. Please re-enforce the importance of compliance with treatment and the need for [place] to monitor compliance data - often an insurance requirement and actually good feedback for the patient as far as how they are doing.  Also remind patient, that any interim PAP machine or mask issues should be first addressed with the DME company, as they can often help better with technical and mask fit issues. Please ask if patient has a preference regarding DME company.  Please also make sure, the patient has a follow-up appointment with me in about 10 weeks from the setup date, thanks. May see one of our nurse practitioners if needed for proper timing of the FU appointment.  Please fax or rout report to the referring provider. Thanks,   Star Age, MD, PhD [facility])

## 2018-08-19 NOTE — Addendum Note (Signed)
Addended by: Star Age on: 08/19/2018 07:50 AM   Modules accepted: Orders

## 2018-09-07 DIAGNOSIS — D3000 Benign neoplasm of unspecified kidney: Secondary | ICD-10-CM | POA: Diagnosis not present

## 2018-09-07 DIAGNOSIS — N5201 Erectile dysfunction due to arterial insufficiency: Secondary | ICD-10-CM | POA: Diagnosis not present

## 2018-09-07 DIAGNOSIS — R3915 Urgency of urination: Secondary | ICD-10-CM | POA: Diagnosis not present

## 2018-09-07 DIAGNOSIS — N401 Enlarged prostate with lower urinary tract symptoms: Secondary | ICD-10-CM | POA: Diagnosis not present

## 2018-09-07 DIAGNOSIS — E291 Testicular hypofunction: Secondary | ICD-10-CM | POA: Diagnosis not present

## 2018-09-30 ENCOUNTER — Encounter: Payer: Self-pay | Admitting: Neurology

## 2018-10-08 DIAGNOSIS — N401 Enlarged prostate with lower urinary tract symptoms: Secondary | ICD-10-CM | POA: Diagnosis not present

## 2018-10-08 DIAGNOSIS — E291 Testicular hypofunction: Secondary | ICD-10-CM | POA: Diagnosis not present

## 2018-10-21 DIAGNOSIS — Z6829 Body mass index (BMI) 29.0-29.9, adult: Secondary | ICD-10-CM | POA: Diagnosis not present

## 2018-10-21 DIAGNOSIS — M1000 Idiopathic gout, unspecified site: Secondary | ICD-10-CM | POA: Diagnosis not present

## 2018-10-21 DIAGNOSIS — E782 Mixed hyperlipidemia: Secondary | ICD-10-CM | POA: Diagnosis not present

## 2018-10-21 DIAGNOSIS — R7309 Other abnormal glucose: Secondary | ICD-10-CM | POA: Diagnosis not present

## 2018-10-21 DIAGNOSIS — I10 Essential (primary) hypertension: Secondary | ICD-10-CM | POA: Diagnosis not present

## 2018-10-21 DIAGNOSIS — E291 Testicular hypofunction: Secondary | ICD-10-CM | POA: Diagnosis not present

## 2018-10-22 DIAGNOSIS — Z23 Encounter for immunization: Secondary | ICD-10-CM | POA: Diagnosis not present

## 2018-11-03 ENCOUNTER — Encounter: Payer: Self-pay | Admitting: Neurology

## 2018-11-09 ENCOUNTER — Ambulatory Visit (INDEPENDENT_AMBULATORY_CARE_PROVIDER_SITE_OTHER): Payer: Medicare Other | Admitting: Neurology

## 2018-11-09 ENCOUNTER — Encounter: Payer: Self-pay | Admitting: Neurology

## 2018-11-09 VITALS — BP 153/82 | HR 81 | Ht 66.0 in | Wt 186.0 lb

## 2018-11-09 DIAGNOSIS — G4733 Obstructive sleep apnea (adult) (pediatric): Secondary | ICD-10-CM

## 2018-11-09 DIAGNOSIS — Z9989 Dependence on other enabling machines and devices: Secondary | ICD-10-CM | POA: Diagnosis not present

## 2018-11-09 NOTE — Progress Notes (Signed)
Subjective:    [name] ID: Dustin [name] is a 67 y.o. male.  HPI     Interim history:   Dustin [name] is a 67 year old right-handed gentleman with an underlying medical history of hypertension, hyperlipidemia, gout, low testosterone, smoking, iron deficiency, and obesity, who presents for follow-up consultation of his obstructive sleep apnea after sleep study testing and starting CPAP therapy. Dustin [name] is unaccompanied today. I first met him on 07/23/2018 at Dustin request of his cardiologist, at which time he reported snoring and excessive daytime somnolence with an Epworth sleepiness score of 10 at Dustin time. He was advised to proceed with sleep study testing. He had a baseline sleep study, followed by a CPAP titration study. I went over his test results with him in detail today. Baseline sleep study from 08/02/2018 showed a sleep latency of 3 minutes, REM latency mildly delayed at 136 minutes, sleep efficiency was mildly reduced at 75.2%. He had an increased wake after sleep onset of 104.5 minutes. He had absence of slow-wave sleep, REM sleep was mildly reduced at 15.8%. Total AHI was in Dustin severe range at 36.8 per hour, REM AHI 72.7 per hour, supine AHI 45.9 per hour. Average oxygen saturation was 94%, nadir was 74%. He had no significant PLMS. He was advised to proceed with a second sleep study for CPAP titration. He had this on 08/16/2018. He was fitted with a full face mask and CPAP was titrated from 5 cm to 12 cm, on Dustin final pressure his AHI was 0 per hour but O2 nadir was 89% with brief supine REM sleep achieved. Sleep efficiency was 72.1%, sleep latency 15.5 minutes, REM sleep markedly delayed and REM percentage reduced at 7.2%. He had no significant PLMS. He was advised to proceed with CPAP therapy at home at a pressure of 13 cm.  Today, 11/09/18: I reviewed his CPAP compliance data from 10/05/2018 through 11/03/2018 which is a total of 30 days, during which time he used his CPAP 24 days with  percent used days greater than 4 hours at 57%, indicating suboptimal compliance with an average usage of 5 hours and 29 minutes, residual AHI borderline at 4.9 per hour, leak on Dustin high side with Dustin 95th percentile at 36.2 L/m on a pressure of 13 cm with EPR of 1. In Dustin month of early October through early November his compliance. 4 hours was excellent at 90%. AHI 5.2 per hour and average blood leak consistently high with Dustin 95th percentile at 41.7 L/m. He reports that he is struggling with Dustin higher leak. He has not yet been in touch with his DME company regarding this issue. He has not noticed any telltale difference when using CPAP, but he is motivated to continue. Maybe, his nocturia is better. He would be willing to get in touch with his DME. Wife seems to think he sleeps better, per [name], in fact, she is sleeping better as he is quieter.  Previously:   07/23/18: (He) reports snoring and excessive daytime somnolence. I reviewed your office note from 05/20/2018, which you kindly included. He had evidence of EKG abnormalities. He was scheduled for further workup including echocardiogram. His Epworth sleepiness score is 10 out of 24 today, fatigue score is 17 out of 63. He is married and lives with his wife, they have 3 children. He works as a Art gallery manager. He smokes cigars occasionally. He drinks alcohol on a regular basis, in Dustin form of Brandy, 4-5 per night. He drinks caffeine in Dustin form  of coffee or tea occasionally. His bedtime is generally between 10 and 10:30, rise time between 6 and 6:30 AM. He does not have a night to night nocturia and denies morning headaches. Generally he wakes up fairly well rested, sometimes he is tired during Dustin day and may take a nap. He has never fallen asleep at Dustin wheel. His snoring sometimes bothers his wife. He is not aware of any family history of OSA. He would be willing to try CPAP if needed. He reports that his echocardiogram was fine. He smokes a cigar a week on  average. He does not have a history of lung disease or asthma in Dustin past.   His Past Medical History Is Significant For: Past Medical History:  Diagnosis Date  . Gout   . Hyperlipidemia   . Hypertension     His Past Surgical History Is Significant For: No past surgical history on file.  His Family History Is Significant For: Family History  Problem Relation Age of Onset  . Heart attack Father     His Social History Is Significant For: Social History   Socioeconomic History  . Marital status: Married    Spouse name: Not on file  . Number of children: Not on file  . Years of education: Not on file  . Highest education level: Not on file  Occupational History  . Not on file  Social Needs  . Financial resource strain: Not on file  . Food insecurity:    Worry: Not on file    Inability: Not on file  . Transportation needs:    Medical: Not on file    Non-medical: Not on file  Tobacco Use  . Smoking status: Current Some Day Smoker  . Smokeless tobacco: Never Used  Substance and Sexual Activity  . Alcohol use: Yes  . Drug use: Not on file  . Sexual activity: Not on file  Lifestyle  . Physical activity:    Days per week: Not on file    Minutes per session: Not on file  . Stress: Not on file  Relationships  . Social connections:    Talks on phone: Not on file    Gets together: Not on file    Attends religious service: Not on file    Active member of club or organization: Not on file    Attends meetings of clubs or organizations: Not on file    Relationship status: Not on file  Other Topics Concern  . Not on file  Social History Narrative  . Not on file    His Allergies Are:  No Known Allergies:   His Current Medications Are:  Outpatient Encounter Medications as of 11/09/2018  Medication Sig  . allopurinol (ZYLOPRIM) 100 MG tablet Take 100 mg by mouth daily.  Marland Kitchen amLODipine (NORVASC) 5 MG tablet Take 5 mg by mouth daily.  Marland Kitchen aspirin EC 81 MG tablet Take 81 mg  by mouth daily.  . Cholecalciferol (VITAMIN D3 PO) Take by mouth.  . Ferrous Sulfate (IRON) 325 (65 Fe) MG TABS Take by mouth.  . hydrochlorothiazide (HYDRODIURIL) 25 MG tablet Take 25 mg by mouth daily.  . Latanoprostene Bunod (VYZULTA) 0.024 % SOLN Apply to eye.  . rosuvastatin (CRESTOR) 20 MG tablet Take 20 mg by mouth daily.  . Testosterone 20.25 MG/1.25GM (1.62%) GEL Place onto Dustin skin.  . verapamil (CALAN-SR) 240 MG CR tablet Take 240 mg by mouth 2 (two) times daily.   No facility-administered encounter medications on file  as of 11/09/2018.   :  Review of Systems:  Out of a complete 14 point review of systems, all are reviewed and negative with Dustin exception of these symptoms as listed below: Review of Systems  Neurological:       Pt presents today to discuss his cpap. Pt reports that he is struggling with leak on his mask. He has not spoken to [employer] regarding a new mask. Pt says that he does not notice a difference in how he feels after using Dustin cpap.    Objective:  Neurological Exam  Physical Exam Physical Examination:   Vitals:   11/09/18 0819  BP: (!) 153/82  Pulse: 81   General Examination: Dustin [name] is a very pleasant 67 y.o. male in no acute distress. He appears well-developed and well-nourished and well groomed.   HEENT: Normocephalic, atraumatic, pupils are equal, round and reactive to light and accommodation. He has dense cataracts, has corrective eyeglasses. He has bilateral lazy eye, right eye seems dominant. Extraocular tracking is good without limitation to gaze excursion or nystagmus noted. Normal smooth pursuit is noted. Hearing is grossly intact. Face is symmetric with normal facial animation and normal facial sensation. Speech is clear with no dysarthria noted. There is no hypophonia. There is no lip, neck/head, jaw or voice tremor. Neck is supple with full range of passive and active motion. There are no carotid bruits on auscultation. Oropharynx exam  reveals: mild mouth dryness, adequate dental hygiene and full dentures in place and fairly significant airway crowding, tongue and palate unremarkable.   Chest: Clear to auscultation without wheezing, rhonchi or crackles noted.  Heart: S1+S2+0, regular and normal with a 2/6 syst murmur.   Abdomen: Soft, non-tender and non-distended with normal bowel sounds appreciated on auscultation.  Extremities: There is no pitting edema in Dustin distal lower extremities bilaterally.   Skin: Warm and dry without trophic changes noted.  Musculoskeletal: exam reveals no obvious joint deformities, tenderness or joint swelling or erythema.   Neurologically:  Mental status: Dustin [name] is awake, alert and oriented in all 4 spheres. His immediate and remote memory, attention, language skills and fund of knowledge are appropriate. There is no evidence of aphasia, agnosia, apraxia or anomia. Speech is clear with normal prosody and enunciation. Thought process is linear. Mood is normal and affect is normal.  Cranial nerves II - XII are as described above under HEENT exam.  Motor exam: Normal bulk, strength and tone is noted. There is no tremor, fine motor skills and coordination: grossly intact.  Cerebellar testing: No dysmetria or intention tremor. There is no truncal or gait ataxia.  Sensory exam: intact to light touch in Dustin upper and lower extremities.  Gait, station and balance: He stands easily. No veering to one side is noted. No leaning to one side is noted. Posture is age-appropriate, stands naturally slightly wider based. Gait unremarkable.  Assessment and Plan:   In summary, Dustin [name] is a very pleasant 67 year old male with an underlying medical history of hypertension, hyperlipidemia, gout, low testosterone, smoking, iron deficiency, and obesity, who presents for FU consultation of his OSA, which was determined to be in Dustin severe range by baseline sleep study testing in early September 2019.  He had a CPAP titration study in September 2019 as well. He has done well with CPAP generally speaking, is compliant with treatment, fulfilled insurance, compliance criteria in Dustin month of October through early November. He has had more trouble with air leaking from Dustin mask.  We talked about his sleep study results in detail today and also went over his compliance data together. He is advised to make an appointment with his DME company regarding Dustin higher leak from Dustin mask and a mask refit. He is motivated to continue with treatment and is commended for his treatment adherence thus far. He has benefited from treatment and that his nocturia may be better and certainly his wife has noticed that he sleeps quieter and less restless, he reports. I suggested a six-month follow-up with one of our nurse practitioners for a brief recheck on his symptoms and compliance. His physical exam is stable. I answered all his questions today and he was in agreement.  I spent 25 minutes in total face-to-face time with Dustin [name], more than 50% of which was spent in counseling and coordination of care, reviewing test results, reviewing medication and discussing or reviewing Dustin diagnosis of OSA, its prognosis and treatment options. Pertinent laboratory and imaging test results that were available during this visit with Dustin [name] were reviewed by me and considered in my medical decision making (see chart for details).

## 2018-11-09 NOTE — Patient Instructions (Addendum)
Please continue using your CPAP regularly. While your insurance requires that you use CPAP at least 4 hours each night on 70% of the nights, I recommend, that you not skip any nights and use it throughout the night if you can. Getting used to CPAP and staying with the treatment long term does take time and patience and discipline. Untreated obstructive sleep apnea when it is moderate to severe can have an adverse impact on cardiovascular health and raise her risk for heart disease, arrhythmias, hypertension, congestive heart failure, stroke and diabetes. Untreated obstructive sleep apnea causes sleep disruption, nonrestorative sleep, and sleep deprivation. This can have an impact on your day to day functioning and cause daytime sleepiness and impairment of cognitive function, memory loss, mood disturbance, and problems focussing. Using CPAP regularly can improve these symptoms.  Keep up the good work, we will have you follow up with Geronimo Running, NP in 6 months.  Please make an appointment with your DME company, [employer], for a mask refit and explain your mask and headgear issues, mouth dryness, air leak ets. Please have them explain how to change the humidifier setting and ramp time.

## 2018-12-21 DIAGNOSIS — I119 Hypertensive heart disease without heart failure: Secondary | ICD-10-CM | POA: Diagnosis not present

## 2018-12-21 DIAGNOSIS — R809 Proteinuria, unspecified: Secondary | ICD-10-CM | POA: Diagnosis not present

## 2018-12-21 DIAGNOSIS — M899 Disorder of bone, unspecified: Secondary | ICD-10-CM | POA: Diagnosis not present

## 2018-12-21 DIAGNOSIS — Z8249 Family history of ischemic heart disease and other diseases of the circulatory system: Secondary | ICD-10-CM | POA: Diagnosis not present

## 2018-12-21 DIAGNOSIS — Z833 Family history of diabetes mellitus: Secondary | ICD-10-CM | POA: Diagnosis not present

## 2018-12-21 DIAGNOSIS — Z79899 Other long term (current) drug therapy: Secondary | ICD-10-CM | POA: Diagnosis not present

## 2018-12-21 DIAGNOSIS — M1000 Idiopathic gout, unspecified site: Secondary | ICD-10-CM | POA: Diagnosis not present

## 2019-02-15 DIAGNOSIS — E291 Testicular hypofunction: Secondary | ICD-10-CM | POA: Diagnosis not present

## 2019-02-15 DIAGNOSIS — M1000 Idiopathic gout, unspecified site: Secondary | ICD-10-CM | POA: Diagnosis not present

## 2019-02-15 DIAGNOSIS — E782 Mixed hyperlipidemia: Secondary | ICD-10-CM | POA: Diagnosis not present

## 2019-02-15 DIAGNOSIS — I10 Essential (primary) hypertension: Secondary | ICD-10-CM | POA: Diagnosis not present

## 2019-03-01 DIAGNOSIS — E291 Testicular hypofunction: Secondary | ICD-10-CM | POA: Diagnosis not present

## 2019-03-08 DIAGNOSIS — E291 Testicular hypofunction: Secondary | ICD-10-CM | POA: Diagnosis not present

## 2019-03-08 DIAGNOSIS — N281 Cyst of kidney, acquired: Secondary | ICD-10-CM | POA: Diagnosis not present

## 2019-03-08 DIAGNOSIS — N5201 Erectile dysfunction due to arterial insufficiency: Secondary | ICD-10-CM | POA: Diagnosis not present

## 2019-04-15 DIAGNOSIS — E782 Mixed hyperlipidemia: Secondary | ICD-10-CM | POA: Diagnosis not present

## 2019-04-15 DIAGNOSIS — E291 Testicular hypofunction: Secondary | ICD-10-CM | POA: Diagnosis not present

## 2019-04-15 DIAGNOSIS — M1000 Idiopathic gout, unspecified site: Secondary | ICD-10-CM | POA: Diagnosis not present

## 2019-04-15 DIAGNOSIS — E785 Hyperlipidemia, unspecified: Secondary | ICD-10-CM | POA: Diagnosis not present

## 2019-04-15 DIAGNOSIS — E119 Type 2 diabetes mellitus without complications: Secondary | ICD-10-CM | POA: Diagnosis not present

## 2019-04-15 DIAGNOSIS — I10 Essential (primary) hypertension: Secondary | ICD-10-CM | POA: Diagnosis not present

## 2019-04-20 DIAGNOSIS — I10 Essential (primary) hypertension: Secondary | ICD-10-CM | POA: Diagnosis not present

## 2019-04-20 DIAGNOSIS — E782 Mixed hyperlipidemia: Secondary | ICD-10-CM | POA: Diagnosis not present

## 2019-04-20 DIAGNOSIS — M130 Polyarthritis, unspecified: Secondary | ICD-10-CM | POA: Diagnosis not present

## 2019-04-20 DIAGNOSIS — E785 Hyperlipidemia, unspecified: Secondary | ICD-10-CM | POA: Diagnosis not present

## 2019-04-20 DIAGNOSIS — E291 Testicular hypofunction: Secondary | ICD-10-CM | POA: Diagnosis not present

## 2019-05-05 ENCOUNTER — Telehealth: Payer: Self-pay | Admitting: *Deleted

## 2019-05-05 ENCOUNTER — Encounter: Payer: Self-pay | Admitting: Neurology

## 2019-05-05 NOTE — Telephone Encounter (Signed)
Noted  

## 2019-05-05 NOTE — Telephone Encounter (Signed)
Due to current COVID 19 pandemic, our office is severely reducing in office visits until further notice, in order to minimize the risk to our patients and healthcare providers. Unable to get in contact with the patient to convert their office visit with [facility] on 05/10/2019 into a doxy.me visit. I left a voicemail asking the patient to return my call. Office number was provided.     If patient calls back please convert their office visit into a doxy.me visit.

## 2019-05-05 NOTE — Telephone Encounter (Signed)
Pt has consented to a Tele Visit.   Pt understands that although there may be some limitations with this type of visit, we will take all precautions to reduce any security or privacy concerns.  Pt understands that this will be treated like an in office visit and we will file with pt's insurance, and there may be a patient responsible charge related to this service.

## 2019-05-10 ENCOUNTER — Other Ambulatory Visit: Payer: Self-pay

## 2019-05-10 ENCOUNTER — Ambulatory Visit (INDEPENDENT_AMBULATORY_CARE_PROVIDER_SITE_OTHER): Payer: Medicare Other | Admitting: Adult Health

## 2019-05-10 DIAGNOSIS — Z9989 Dependence on other enabling machines and devices: Secondary | ICD-10-CM | POA: Diagnosis not present

## 2019-05-10 DIAGNOSIS — G4733 Obstructive sleep apnea (adult) (pediatric): Secondary | ICD-10-CM | POA: Diagnosis not present

## 2019-05-10 NOTE — Progress Notes (Addendum)
[facility] [address] [place]. [place] [address] [phone]     Virtual Visit via Telephone Note  I connected with Dustin Brown on 05/10/19 at  9:00 AM EDT by telephone located remotely at [facility] and verified that I am speaking with the correct person using two identifiers who reports being located at home.   Visit scheduled by RN. She discussed the limitations, risks, security and privacy concerns of performing an evaluation and management service by telephone and the availability of in person appointments. She also discussed with the patient that there may be a patient responsible charge related to this service. The patient expressed understanding and agreed to proceed. See telephone note for consent and additional scheduling information.    History of Present Illness:  Dustin Brown is a 68 y.o. male who has been followed in this office for obstructive sleep apnea on CPAP.  He was initially scheduled for face-to-face office follow up visit today time but due to [place], visit rescheduled for non-face-to-face telephone visit with patients consent. Unable to participate in video visit due to lack of access to device with camera.    Today 05/10/19 Dustin Brown is a 68 year old male with a history of obstructive sleep apnea on CPAP.  His download indicates that he uses machine 25 out of 30 days for compliance of 83%.  He uses machine greater than 4 hours 22 days for compliance of 73%.  On average he uses his machine 6 hours and 8 minutes.  His residual AHI is 5.6 on 13 cm of water with EPR of 1.  His leak in the 95th percentile is 40.9 L/min.  Reports that he changed his supplies approximately 1 month ago.  He states that he occasionally does feel the mask leaking at night.  HISTORY 11/09/18 (copied from Dr. Guadelupe Brown note) I reviewed his CPAP compliance data from 10/05/2018 through 11/03/2018 which is a total of 30 days, during which time he used his  CPAP 24 days with percent used days greater than 4 hours at 57%, indicating suboptimal compliance with an average usage of 5 hours and 29 minutes, residual AHI borderline at 4.9 per hour, leak on the high side with the 95th percentile at 36.2 L/m on a pressure of 13 cm with EPR of 1. In the month of early October through early November his compliance. 4 hours was excellent at 90%. AHI 5.2 per hour and average blood leak consistently high with the 95th percentile at 41.7 L/m. He reports that he is struggling with the higher leak. He has not yet been in touch with his DME company regarding this issue. He has not noticed any telltale difference when using CPAP, but he is motivated to continue. Maybe, his nocturia is better. He would be willing to get in touch with his DME. Wife seems to think he sleeps better, per patient, in fact, she is sleeping better as he is quieter.    Observations/Objective:   Neurological examination  Mentation: Alert oriented to time, place, history taking. speech and language fluent  Assessment and Plan:  1.  Obstructive sleep apnea on CPAP  The patient CPAP download shows suboptimal compliance and good treatment of his apnea.  He has had a significant leak.  I will see him for mask refitting with his DME company.  He is advised that if his mask continues to leak he should let us know.  He will follow-up in 6 months or sooner if needed.  Follow Up Instructions:  Follow-up in 6 months  I discussed the assessment and treatment plan with the patient.  The patient was provided an opportunity to ask questions and all were answered to their satisfaction. The patient agreed with the plan and verbalized an understanding of the instructions.   I provided 10 minutes of non-face-to-face time during this encounter.     Ward Givens, MSN, NP-C 05/10/2019, 9:09 AM [facility] [address], [place], [place] [address] [phone]   I reviewed  the above note and documentation by the Nurse Practitioner and agree with the history, exam, assessment and plan as outlined above. I was immediately available for consultation. Star Age, MD, PhD [facility])

## 2019-05-11 NOTE — Progress Notes (Signed)
CMM sent and received by [email or web address] sy

## 2019-06-24 ENCOUNTER — Encounter: Payer: Self-pay | Admitting: Cardiology

## 2019-06-24 DIAGNOSIS — M109 Gout, unspecified: Secondary | ICD-10-CM | POA: Insufficient documentation

## 2019-06-24 DIAGNOSIS — E785 Hyperlipidemia, unspecified: Secondary | ICD-10-CM

## 2019-06-24 DIAGNOSIS — I10 Essential (primary) hypertension: Secondary | ICD-10-CM | POA: Insufficient documentation

## 2019-06-24 HISTORY — DX: Gout, unspecified: M10.9

## 2019-06-24 HISTORY — DX: Hyperlipidemia, unspecified: E78.5

## 2019-06-24 HISTORY — DX: Essential (primary) hypertension: I10

## 2019-06-28 ENCOUNTER — Ambulatory Visit: Payer: Self-pay | Admitting: Cardiology

## 2019-07-13 DIAGNOSIS — Z0000 Encounter for general adult medical examination without abnormal findings: Secondary | ICD-10-CM | POA: Diagnosis not present

## 2019-07-25 DIAGNOSIS — Z23 Encounter for immunization: Secondary | ICD-10-CM | POA: Diagnosis not present

## 2019-07-26 ENCOUNTER — Other Ambulatory Visit: Payer: Self-pay

## 2019-07-26 ENCOUNTER — Ambulatory Visit (INDEPENDENT_AMBULATORY_CARE_PROVIDER_SITE_OTHER): Payer: Medicare Other | Admitting: Cardiology

## 2019-07-26 ENCOUNTER — Encounter: Payer: Self-pay | Admitting: Cardiology

## 2019-07-26 VITALS — BP 143/80 | HR 70 | Temp 98.2°F | Ht 66.0 in | Wt 190.0 lb

## 2019-07-26 DIAGNOSIS — I10 Essential (primary) hypertension: Secondary | ICD-10-CM | POA: Diagnosis not present

## 2019-07-26 DIAGNOSIS — Z9989 Dependence on other enabling machines and devices: Secondary | ICD-10-CM | POA: Diagnosis not present

## 2019-07-26 DIAGNOSIS — G4733 Obstructive sleep apnea (adult) (pediatric): Secondary | ICD-10-CM | POA: Diagnosis not present

## 2019-07-26 NOTE — Progress Notes (Signed)
Follow up visit  Subjective:   Dustin Brown, male    DOB: [birth year], 68 y.o.   MRN: [id number]   Chief Complaint  Patient presents with  . Hypertension  . Follow-up    1 year   HPI  68 year old African-American male with hypertension, hyperlipidemia, moderate obesity,  Patient was previously seen for abnormal EKG showing inferior Q waves.  However, echocardiogram showed normal wall motion without any evidence of old infarct.  Patient denies chest pain, shortness of breath, palpitations, leg edema, orthopnea, PND, TIA/syncope.  His regular follow-up with his PCP Dr. Criss Rosales regarding his hypertension.  Patient has OSA, but not always compliant with CPAP use.  He also continues to drink about half pack of beer every night.    Past Medical History:  Diagnosis Date  . Gout   . Gout 06/24/2019  . HLD (hyperlipidemia) 06/24/2019  . HTN (hypertension) 06/24/2019  . Hyperlipidemia   . Hypertension      History reviewed. No pertinent surgical history.   Social History   Socioeconomic History  . Marital status: Married    Spouse name: Not on file  . Number of children: 3  . Years of education: Not on file  . Highest education level: Not on file  Occupational History  . Not on file  Social Needs  . Financial resource strain: Not on file  . Food insecurity    Worry: Not on file    Inability: Not on file  . Transportation needs    Medical: Not on file    Non-medical: Not on file  Tobacco Use  . Smoking status: Current Every Day Smoker    Types: Cigars  . Smokeless tobacco: Never Used  . Tobacco comment: 1 every 4-5 days   Substance and Sexual Activity  . Alcohol use: Yes    Comment: occ  . Drug use: Not on file  . Sexual activity: Not on file  Lifestyle  . Physical activity    Days per week: Not on file    Minutes per session: Not on file  . Stress: Not on file  Relationships  . Social Herbalist on phone: Not on file    Gets together: Not on file   Attends religious service: Not on file    Active member of club or organization: Not on file    Attends meetings of clubs or organizations: Not on file    Relationship status: Not on file  . Intimate partner violence    Fear of current or ex partner: Not on file    Emotionally abused: Not on file    Physically abused: Not on file    Forced sexual activity: Not on file  Other Topics Concern  . Not on file  Social History Narrative  . Not on file     Family History  Problem Relation Age of Onset  . Heart attack Father   . Heart attack Brother        at 88      Current Outpatient Medications on File Prior to Visit  Medication Sig Dispense Refill  . allopurinol (ZYLOPRIM) 100 MG tablet Take 100 mg by mouth daily.    Marland Kitchen amLODipine (NORVASC) 5 MG tablet Take 5 mg by mouth daily.    Marland Kitchen aspirin EC 81 MG tablet Take 81 mg by mouth daily.    . Cholecalciferol (VITAMIN D3 PO) Take by mouth.    . Ferrous Sulfate (IRON) 325 (65 Fe) MG TABS  Take by mouth.    Marland Kitchen GLUCOSAMINE HCL PO Take by mouth 3 (three) times daily.    . hydrochlorothiazide (HYDRODIURIL) 25 MG tablet Take 25 mg by mouth daily.    . Latanoprostene Bunod (VYZULTA) 0.024 % SOLN Apply to eye.    . rosuvastatin (CRESTOR) 20 MG tablet Take 20 mg by mouth daily.    . verapamil (CALAN-SR) 240 MG CR tablet Take 240 mg by mouth 2 (two) times daily.     No current facility-administered medications on file prior to visit.     Cardiovascular studies:  EKG 07/26/2019: Sinus rhythm 71 bpm.  Left anterior fascicular block.  Sleep study 08/04/2018: Severe obstructive sleep apnea. AHI 36.8/hour REMI AHI 72.4/hr. supine AHI 45.9/hr. O2 nadir 74%  Recommendation: CPAP Will require repeat sleep study for proper titration  Echocardiogram 06/18/2018: Left ventricle cavity is normal in size. Moderate concentric hypertrophy of the left ventricle. Doppler evidence of grade II (pseudonormal) diastolic dysfunction. Diastolic dysfunction  findings suggests elevated LA/LV end diastolic pressure. Normal global wall motion. Calculated EF 64%. Left atrial cavity is mildly dilated at 4.3 cm. Trileaflet aortic valve with trace regurgitation. Mild (Grade I) mitral regurgitation. Mild tricuspid regurgitation. No evidence of pulmonary hypertension.  Recent labs: 04/21/2018: H/H 14/41. MCV 86. Platelets 260.  GLucose 95. BUN/Cr 21/1.08. eGFR 72/84. Na/K 141/3.5 Thyroid function normal CHol 135, HDL 60, TG 133, LDL 54 Apolipoprotein B normal Lp(a) Normal HS CRP >10 High    Review of Systems  Constitution: Negative for decreased appetite, malaise/fatigue, weight gain and weight loss.  HENT: Negative for congestion.   Eyes: Negative for visual disturbance.  Cardiovascular: Negative for chest pain, dyspnea on exertion, leg swelling, palpitations and syncope.  Respiratory: Negative for cough.   Endocrine: Negative for cold intolerance.  Hematologic/Lymphatic: Does not bruise/bleed easily.  Skin: Negative for itching and rash.  Musculoskeletal: Negative for myalgias.  Gastrointestinal: Negative for abdominal pain, nausea and vomiting.  Genitourinary: Negative for dysuria.  Neurological: Negative for dizziness and weakness.  Psychiatric/Behavioral: The patient is not nervous/anxious.   All other systems reviewed and are negative.        Vitals:   07/26/19 1016  BP: (!) 160/86  Pulse: 72  Temp: 98.2 F (36.8 C)  SpO2: 98%    Body mass index is 30.67 kg/m. Filed Weights   07/26/19 1016  Weight: 190 lb (86.2 kg)     Objective:   Physical Exam  Constitutional: He is oriented to person, place, and time. He appears well-developed and well-nourished. No distress.  HENT:  Head: Normocephalic and atraumatic.  Eyes: Pupils are equal, round, and reactive to light. Conjunctivae are normal.  Neck: No JVD present.  Cardiovascular: Normal rate, regular rhythm and intact distal pulses.  Pulmonary/Chest: Effort normal and  breath sounds normal. He has no wheezes. He has no rales.  Abdominal: Soft. Bowel sounds are normal. There is no rebound.  Musculoskeletal:        General: No edema.  Lymphadenopathy:    He has no cervical adenopathy.  Neurological: He is alert and oriented to person, place, and time. No cranial nerve deficit.  Skin: Skin is warm and dry.  Psychiatric: He has a normal mood and affect.  Nursing note and vitals reviewed.         Assessment & Recommendations:   68 year old African-American male with hypertension, hyperlipidemia, moderate obesit  Hypertension: Suboptimal. Continue current medical therapy and follow-up with PCP. Recommend reduction in alcohol intake, regular use of CPAP, regular  exercise and weight loss.  In absence of primary indication, okay to discontinue aspirin.  Continue follow-up with PCP.  I will see him on as-needed basis.  Nigel Mormon, MD [facility] Pager: [phone] Office: [phone] If no answer Cell [phone]

## 2019-08-23 DIAGNOSIS — I10 Essential (primary) hypertension: Secondary | ICD-10-CM | POA: Diagnosis not present

## 2019-08-23 DIAGNOSIS — E785 Hyperlipidemia, unspecified: Secondary | ICD-10-CM | POA: Diagnosis not present

## 2019-08-23 DIAGNOSIS — M1000 Idiopathic gout, unspecified site: Secondary | ICD-10-CM | POA: Diagnosis not present

## 2019-08-23 DIAGNOSIS — M130 Polyarthritis, unspecified: Secondary | ICD-10-CM | POA: Diagnosis not present

## 2019-08-31 DIAGNOSIS — H401131 Primary open-angle glaucoma, bilateral, mild stage: Secondary | ICD-10-CM | POA: Diagnosis not present

## 2019-08-31 DIAGNOSIS — H2513 Age-related nuclear cataract, bilateral: Secondary | ICD-10-CM | POA: Diagnosis not present

## 2019-08-31 DIAGNOSIS — H04123 Dry eye syndrome of bilateral lacrimal glands: Secondary | ICD-10-CM | POA: Diagnosis not present

## 2019-08-31 DIAGNOSIS — H5015 Alternating exotropia: Secondary | ICD-10-CM | POA: Diagnosis not present

## 2019-09-06 DIAGNOSIS — R3121 Asymptomatic microscopic hematuria: Secondary | ICD-10-CM | POA: Diagnosis not present

## 2019-09-06 DIAGNOSIS — N5201 Erectile dysfunction due to arterial insufficiency: Secondary | ICD-10-CM | POA: Diagnosis not present

## 2019-09-06 DIAGNOSIS — E291 Testicular hypofunction: Secondary | ICD-10-CM | POA: Diagnosis not present

## 2019-09-07 ENCOUNTER — Telehealth: Payer: Self-pay

## 2019-09-07 NOTE — Telephone Encounter (Signed)
Order has been faxed back to [place]. Confirmation fax has been received.

## 2019-09-22 DIAGNOSIS — E291 Testicular hypofunction: Secondary | ICD-10-CM | POA: Diagnosis not present

## 2019-09-22 DIAGNOSIS — R948 Abnormal results of function studies of other organs and systems: Secondary | ICD-10-CM | POA: Diagnosis not present

## 2019-11-03 ENCOUNTER — Encounter: Payer: Self-pay | Admitting: Neurology

## 2019-11-08 ENCOUNTER — Ambulatory Visit (INDEPENDENT_AMBULATORY_CARE_PROVIDER_SITE_OTHER): Payer: Medicare Other | Admitting: Adult Health

## 2019-11-08 ENCOUNTER — Encounter: Payer: Self-pay | Admitting: Adult Health

## 2019-11-08 ENCOUNTER — Other Ambulatory Visit: Payer: Self-pay

## 2019-11-08 VITALS — BP 148/80 | HR 57 | Temp 97.7°F | Ht 66.0 in | Wt 185.4 lb

## 2019-11-08 DIAGNOSIS — Z9989 Dependence on other enabling machines and devices: Secondary | ICD-10-CM | POA: Diagnosis not present

## 2019-11-08 DIAGNOSIS — G4733 Obstructive sleep apnea (adult) (pediatric): Secondary | ICD-10-CM | POA: Diagnosis not present

## 2019-11-08 NOTE — Progress Notes (Addendum)
PATIENT: Dustin Brown DOB: 1951/03/25  REASON FOR VISIT: follow up HISTORY FROM: patient  HISTORY OF PRESENT ILLNESS: Today 11/08/19: Dustin Brown is a 68 year old male with a history of obstructive sleep apnea on CPAP.  He returns today for follow-up.  His download indicates that he uses machine 25 out of 30 days for compliance of 83%.  He uses machine greater than 4 hours for 14 days for compliance of 47%.  On average he uses his machine 4 hours and 34 minutes.  His residual AHI is 5.9 on 13 cm of water with EPR of 1.  His leak in the 95th percentile is 24.3 L/min.  He reports that some nights he has a hard time falling asleep with the mask on.  He also states that sometimes his mask will leak if he does not have the straps tight enough.  He returns today for an evaluation.  HISTORY 05/10/19 Dustin Brown is a 68 year old male with a history of obstructive sleep apnea on CPAP.  His download indicates that he uses machine 25 out of 30 days for compliance of 83%.  He uses machine greater than 4 hours 22 days for compliance of 73%.  On average he uses his machine 6 hours and 8 minutes.  His residual AHI is 5.6 on 13 cm of water with EPR of 1.  His leak in the 95th percentile is 40.9 L/min.  Reports that he changed his supplies approximately 1 month ago.  He states that he occasionally does feel the mask leaking at night.  REVIEW OF SYSTEMS: Out of a complete 14 system review of symptoms, the patient complains only of the following symptoms, and all other reviewed systems are negative.  See HPI  ALLERGIES: No Known Allergies  HOME MEDICATIONS: Outpatient Medications Prior to Visit  Medication Sig Dispense Refill  . allopurinol (ZYLOPRIM) 100 MG tablet Take 100 mg by mouth daily.    Marland Kitchen amLODipine (NORVASC) 5 MG tablet Take 5 mg by mouth daily.    . Cholecalciferol (VITAMIN D3 PO) Take by mouth.    . Ferrous Sulfate (IRON) 325 (65 Fe) MG TABS Take by mouth.    Marland Kitchen GLUCOSAMINE HCL PO Take by mouth  3 (three) times daily.    . hydrochlorothiazide (HYDRODIURIL) 25 MG tablet Take 25 mg by mouth daily.    . Latanoprostene Bunod (VYZULTA) 0.024 % SOLN Apply to eye.    . rosuvastatin (CRESTOR) 20 MG tablet Take 20 mg by mouth daily.    Marland Kitchen UNABLE TO FIND OTC Testosterone from "health food store."    . verapamil (CALAN-SR) 240 MG CR tablet Take 240 mg by mouth 2 (two) times daily.     No facility-administered medications prior to visit.    PAST MEDICAL HISTORY: Past Medical History:  Diagnosis Date  . Gout   . Gout 06/24/2019  . HLD (hyperlipidemia) 06/24/2019  . HTN (hypertension) 06/24/2019  . Hyperlipidemia   . Hypertension     PAST SURGICAL HISTORY: No past surgical history on file.  FAMILY HISTORY: Family History  Problem Relation Age of Onset  . Heart attack Father   . Heart attack Brother        at 64     SOCIAL HISTORY: Social History   Socioeconomic History  . Marital status: Married    Spouse name: Not on file  . Number of children: 3  . Years of education: Not on file  . Highest education level: Not on file  Occupational History  .  Not on file  Tobacco Use  . Smoking status: Current Every Day Smoker    Types: Cigars  . Smokeless tobacco: Never Used  . Tobacco comment: 1 every 4-5 days   Substance and Sexual Activity  . Alcohol use: Yes    Comment: occ  . Drug use: Not on file  . Sexual activity: Not on file  Other Topics Concern  . Not on file  Social History Narrative  . Not on file   Social Determinants of Health   Financial Resource Strain:   . Difficulty of Paying Living Expenses: Not on file  Food Insecurity:   . Worried About Charity fundraiser in the Last Year: Not on file  . Ran Out of Food in the Last Year: Not on file  Transportation Needs:   . Lack of Transportation (Medical): Not on file  . Lack of Transportation (Non-Medical): Not on file  Physical Activity:   . Days of Exercise per Week: Not on file  . Minutes of Exercise per  Session: Not on file  Stress:   . Feeling of Stress : Not on file  Social Connections:   . Frequency of Communication with Friends and Family: Not on file  . Frequency of Social Gatherings with Friends and Family: Not on file  . Attends Religious Services: Not on file  . Active Member of Clubs or Organizations: Not on file  . Attends Archivist Meetings: Not on file  . Marital Status: Not on file  Intimate Partner Violence:   . Fear of Current or Ex-Partner: Not on file  . Emotionally Abused: Not on file  . Physically Abused: Not on file  . Sexually Abused: Not on file      PHYSICAL EXAM  Vitals:   11/08/19 0751  BP: (!) 148/80  Pulse: (!) 57  Temp: 97.7 F (36.5 C)  Weight: 185 lb 6.4 oz (84.1 kg)  Height: 5\' 6"  (1.676 m)   Body mass index is 29.92 kg/m.  Generalized: Well developed, in no acute distress  Chest: Lungs clear to auscultation bilaterally  Neurological examination  Mentation: Alert oriented to time, place, history taking. Follows all commands speech and language fluent Cranial nerve II-XII: Extraocular movements were full, visual field were full on confrontational test Head turning and shoulder shrug  were normal and symmetric. Motor: The motor testing reveals 5 over 5 strength of all 4 extremities. Good symmetric motor tone is noted throughout.  Sensory: Sensory testing is intact to soft touch on all 4 extremities. No evidence of extinction is noted.  Gait and station: Gait is normal.    DIAGNOSTIC DATA (LABS, IMAGING, TESTING) - I reviewed patient records, labs, notes, testing and imaging myself where available.     ASSESSMENT AND PLAN 68 y.o. year old male  has a past medical history of Gout, Gout (06/24/2019), HLD (hyperlipidemia) (06/24/2019), HTN (hypertension) (06/24/2019), Hyperlipidemia, and Hypertension. here with:  1. Obstructive sleep apnea on CPAP  The patient's CPAP download shows suboptimal compliance.  His residual AHI is  mildly elevated.  He is encouraged to continue using CPAP nightly and greater than 4 hours each night.  He is also encouraged to try wearing the CPAP during the day while watching television or reading in order to get him comfortable with using the mask.  He is also advised that if he continues to feel the mask leak despite tightening his straps he should let us know.  He is advised that if his symptoms  worsen or he develops new symptoms he should let us know.  He will follow-up in 1 year or sooner if needed   I spent 15 minutes with the patient. 50% of this time was spent reviewing CPAP download   Ward Givens, MSN, NP-C 11/08/2019, 8:19 AM [facility] [address], [place], [place] [address] [phone]  I reviewed the above note and documentation by the Nurse Practitioner and agree with the history, exam, assessment and plan as outlined above. I was available for consultation. Star Age, MD, PhD [facility])

## 2019-11-08 NOTE — Patient Instructions (Signed)
Your Plan:  Continue using CPAP nightly and greater than 4 hours each night Try using the machine during the day when watching TV or reading to get used to it.  Make sure mask is tight enough that it doesn't leak but is still comfortable. If not, we need to do a mask refitting.  If your symptoms worsen or you develop new symptoms please let us know.    Thank you for coming to see [place] at [facility]. I hope we have been able to provide you high quality care today.  You may receive a patient satisfaction survey over the next few weeks. We would appreciate your feedback and comments so that we may continue to improve ourselves and the health of our patients.

## 2019-12-13 DIAGNOSIS — E1169 Type 2 diabetes mellitus with other specified complication: Secondary | ICD-10-CM | POA: Diagnosis not present

## 2019-12-13 DIAGNOSIS — I10 Essential (primary) hypertension: Secondary | ICD-10-CM | POA: Diagnosis not present

## 2019-12-13 DIAGNOSIS — E785 Hyperlipidemia, unspecified: Secondary | ICD-10-CM | POA: Diagnosis not present

## 2019-12-20 DIAGNOSIS — I10 Essential (primary) hypertension: Secondary | ICD-10-CM | POA: Diagnosis not present

## 2019-12-20 DIAGNOSIS — E782 Mixed hyperlipidemia: Secondary | ICD-10-CM | POA: Diagnosis not present

## 2019-12-20 DIAGNOSIS — M1000 Idiopathic gout, unspecified site: Secondary | ICD-10-CM | POA: Diagnosis not present

## 2019-12-20 DIAGNOSIS — E291 Testicular hypofunction: Secondary | ICD-10-CM | POA: Diagnosis not present

## 2020-01-04 ENCOUNTER — Ambulatory Visit (HOSPITAL_COMMUNITY)
Admission: EM | Admit: 2020-01-04 | Discharge: 2020-01-04 | Disposition: A | Discharge status: Home / Self Care | Payer: Medicare Other | Attending: Family Medicine | Admitting: Family Medicine

## 2020-01-04 ENCOUNTER — Other Ambulatory Visit: Payer: Self-pay

## 2020-01-04 ENCOUNTER — Encounter (HOSPITAL_COMMUNITY): Payer: Self-pay | Admitting: Emergency Medicine

## 2020-01-04 DIAGNOSIS — Z20822 Contact with and (suspected) exposure to covid-19: Secondary | ICD-10-CM | POA: Diagnosis not present

## 2020-01-04 DIAGNOSIS — I10 Essential (primary) hypertension: Secondary | ICD-10-CM

## 2020-01-04 NOTE — ED Triage Notes (Signed)
Pt here for covid testing after possible exposure last week; denies sx

## 2020-01-04 NOTE — ED Provider Notes (Signed)
[place]   AV:[id number] 01/04/20 Arrival Time: [id number]  ASSESSMENT & PLAN:  1. Exposure to COVID-19 virus   2. Elevated blood pressure reading with diagnosis of hypertension      COVID-19 testing sent. See letter/work note on file for self-isolation guidelines.   Follow-up Information    Lucianne Lei, MD.   Specialty: Family Medicine Why: As needed. Contact information: [address] [place] [address] [phone]            Discharge Instructions     You have been tested for COVID-19 today. If your test returns positive, you will receive a phone call from [facility] regarding your results. Negative test results are not called. Both positive and negative results area always visible on MyChart. If you do not have a MyChart account, sign up instructions are provided in your discharge papers. Please do not hesitate to contact us should you have questions or concerns.  Your blood pressure was noted to be elevated during your visit today. You may return here within the next few days to recheck if unable to see your primary care doctor.  BP (!) 150/93 (BP Location: Right Arm)   Pulse 74   Temp 98.7 F (37.1 C) (Oral)   Resp 18   SpO2 97%         Reviewed expectations re: course of current medical issues. Questions answered. Outlined signs and symptoms indicating need for more acute intervention. Understanding verbalized. After Visit Summary given.   SUBJECTIVE: History from: patient. Dustin Brown is a 69 y.o. male who requests COVID-19 testing. Known COVID-19 contact: reports recent contact. Recent travel: none. Denies: runny nose, congestion, fever, cough, sore throat, difficulty breathing and headache. Normal PO intake without n/v/d.  Increased blood pressure noted today. Reports that he is treated for HTN. He reports taking medications as instructed, no chest pain on exertion, no dyspnea on exertion, no orthostatic dizziness or  lightheadedness and no orthopnea or paroxysmal nocturnal dyspnea.   OBJECTIVE:  Vitals:   01/04/20 1440  BP: (!) 150/93  Pulse: 74  Resp: 18  Temp: 98.7 F (37.1 C)  TempSrc: Oral  SpO2: 97%    General appearance: alert; no distress Eyes: PERRLA; EOMI; conjunctiva normal HENT: [place]; AT; nasal mucosa normal; oral mucosa normal Neck: supple  Lungs: speaks full sentences without difficulty; unlabored Extremities: no edema Skin: warm and dry Neurologic: normal gait Psychological: alert and cooperative; normal mood and affect  Labs:  Labs Reviewed  NOVEL CORONAVIRUS, NAA (HOSP ORDER, SEND-OUT TO REF LAB; TAT 18-24 HRS)     No Known Allergies  Past Medical History:  Diagnosis Date  . Gout   . Gout 06/24/2019  . HLD (hyperlipidemia) 06/24/2019  . HTN (hypertension) 06/24/2019  . Hyperlipidemia   . Hypertension    Social History   Socioeconomic History  . Marital status: Married    Spouse name: Not on file  . Number of children: 3  . Years of education: Not on file  . Highest education level: Not on file  Occupational History  . Not on file  Tobacco Use  . Smoking status: Current Every Day Smoker    Types: Cigars  . Smokeless tobacco: Never Used  . Tobacco comment: 1 every 4-5 days   Substance and Sexual Activity  . Alcohol use: Yes    Comment: occ  . Drug use: Not on file  . Sexual activity: Not on file  Other Topics Concern  . Not on  file  Social History Narrative  . Not on file   Social Determinants of Health   Financial Resource Strain:   . Difficulty of Paying Living Expenses: Not on file  Food Insecurity:   . Worried About Charity fundraiser in the Last Year: Not on file  . Ran Out of Food in the Last Year: Not on file  Transportation Needs:   . Lack of Transportation (Medical): Not on file  . Lack of Transportation (Non-Medical): Not on file  Physical Activity:   . Days of Exercise per Week: Not on file  . Minutes of Exercise per Session: Not  on file  Stress:   . Feeling of Stress : Not on file  Social Connections:   . Frequency of Communication with Friends and Family: Not on file  . Frequency of Social Gatherings with Friends and Family: Not on file  . Attends Religious Services: Not on file  . Active Member of Clubs or Organizations: Not on file  . Attends Archivist Meetings: Not on file  . Marital Status: Not on file  Intimate Partner Violence:   . Fear of Current or Ex-Partner: Not on file  . Emotionally Abused: Not on file  . Physically Abused: Not on file  . Sexually Abused: Not on file   Family History  Problem Relation Age of Onset  . Heart attack Father   . Heart attack Brother        at 17    History reviewed. No pertinent surgical history.   [name], MD 01/04/20 1505

## 2020-01-04 NOTE — Discharge Instructions (Addendum)
You have been tested for COVID-19 today. If your test returns positive, you will receive a phone call from [facility] regarding your results. Negative test results are not called. Both positive and negative results area always visible on MyChart. If you do not have a MyChart account, sign up instructions are provided in your discharge papers. Please do not hesitate to contact us should you have questions or concerns.  Your blood pressure was noted to be elevated during your visit today. You may return here within the next few days to recheck if unable to see your primary care doctor.  BP (!) 150/93 (BP Location: Right Arm)    Pulse 74    Temp 98.7 F (37.1 C) (Oral)    Resp 18    SpO2 97%

## 2020-01-06 LAB — NOVEL CORONAVIRUS, NAA (HOSP ORDER, SEND-OUT TO REF LAB; TAT 18-24 HRS): SARS-CoV-2, NAA: NOT DETECTED

## 2020-04-17 ENCOUNTER — Other Ambulatory Visit: Payer: Self-pay | Admitting: Family Medicine

## 2020-04-17 ENCOUNTER — Ambulatory Visit
Admission: RE | Admit: 2020-04-17 | Discharge: 2020-04-17 | Disposition: A | Discharge status: Home / Self Care | Payer: Medicare Other | Source: Ambulatory Visit | Attending: Family Medicine | Admitting: Family Medicine

## 2020-04-17 DIAGNOSIS — M1990 Unspecified osteoarthritis, unspecified site: Secondary | ICD-10-CM

## 2020-04-17 DIAGNOSIS — M1000 Idiopathic gout, unspecified site: Secondary | ICD-10-CM | POA: Diagnosis not present

## 2020-04-17 DIAGNOSIS — M79621 Pain in right upper arm: Secondary | ICD-10-CM | POA: Diagnosis not present

## 2020-04-17 DIAGNOSIS — M25511 Pain in right shoulder: Secondary | ICD-10-CM | POA: Diagnosis not present

## 2020-04-17 DIAGNOSIS — E785 Hyperlipidemia, unspecified: Secondary | ICD-10-CM | POA: Diagnosis not present

## 2020-04-17 DIAGNOSIS — I10 Essential (primary) hypertension: Secondary | ICD-10-CM | POA: Diagnosis not present

## 2020-04-17 DIAGNOSIS — M130 Polyarthritis, unspecified: Secondary | ICD-10-CM | POA: Diagnosis not present

## 2020-08-14 DIAGNOSIS — M1000 Idiopathic gout, unspecified site: Secondary | ICD-10-CM | POA: Diagnosis not present

## 2020-08-14 DIAGNOSIS — M130 Polyarthritis, unspecified: Secondary | ICD-10-CM | POA: Diagnosis not present

## 2020-08-14 DIAGNOSIS — E785 Hyperlipidemia, unspecified: Secondary | ICD-10-CM | POA: Diagnosis not present

## 2020-08-14 DIAGNOSIS — E291 Testicular hypofunction: Secondary | ICD-10-CM | POA: Diagnosis not present

## 2020-08-14 DIAGNOSIS — I10 Essential (primary) hypertension: Secondary | ICD-10-CM | POA: Diagnosis not present

## 2020-08-21 DIAGNOSIS — I10 Essential (primary) hypertension: Secondary | ICD-10-CM | POA: Diagnosis not present

## 2020-08-21 DIAGNOSIS — R7309 Other abnormal glucose: Secondary | ICD-10-CM | POA: Diagnosis not present

## 2020-08-21 DIAGNOSIS — M130 Polyarthritis, unspecified: Secondary | ICD-10-CM | POA: Diagnosis not present

## 2020-08-21 DIAGNOSIS — E782 Mixed hyperlipidemia: Secondary | ICD-10-CM | POA: Diagnosis not present

## 2020-08-21 DIAGNOSIS — E291 Testicular hypofunction: Secondary | ICD-10-CM | POA: Diagnosis not present

## 2020-08-21 DIAGNOSIS — Z23 Encounter for immunization: Secondary | ICD-10-CM | POA: Diagnosis not present

## 2020-09-02 IMAGING — DX DG SHOULDER 2+V*R*
2 series · 2 of 2 positions shown · non-contrast
Comparison: None.

CLINICAL DATA: Chronic right shoulder pain and decreased range of
motion. No reported injury.

EXAM:
RIGHT SHOULDER - 2+ VIEW

[dg shoulder right (1 of 2)]
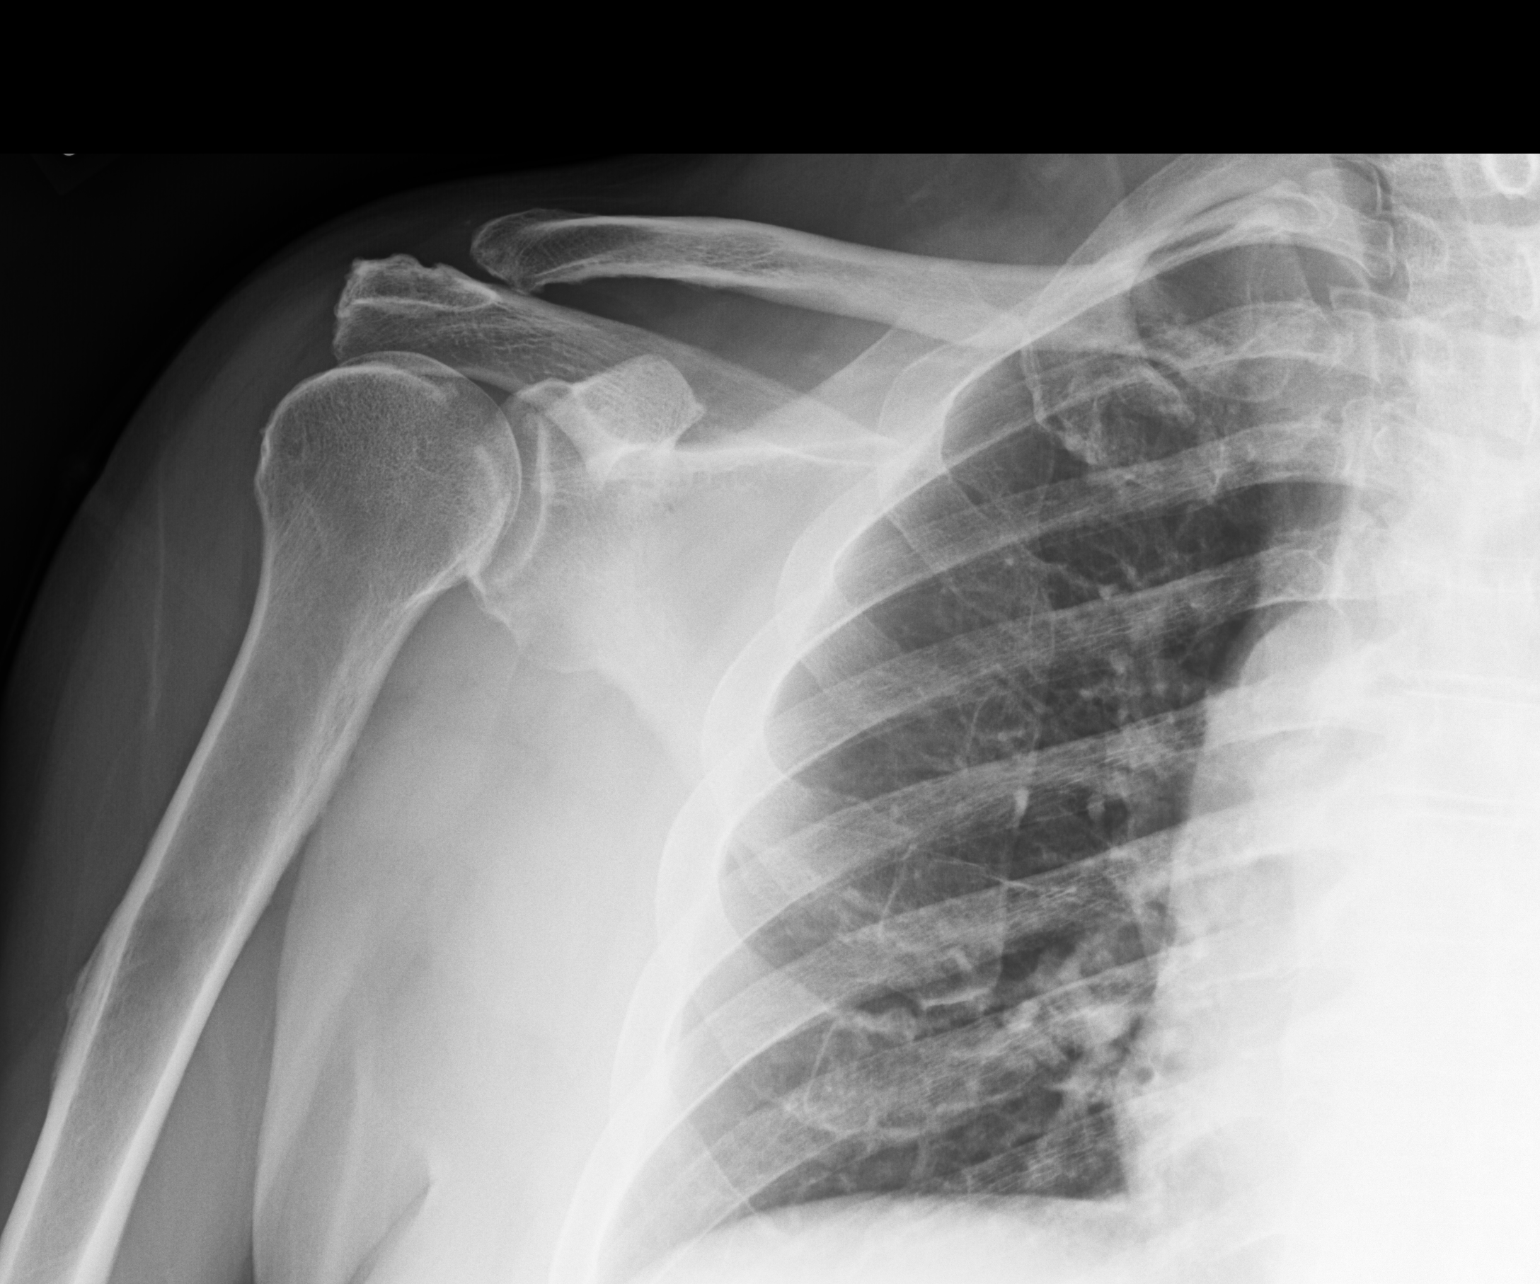

[dg shoulder right (2 of 2)]
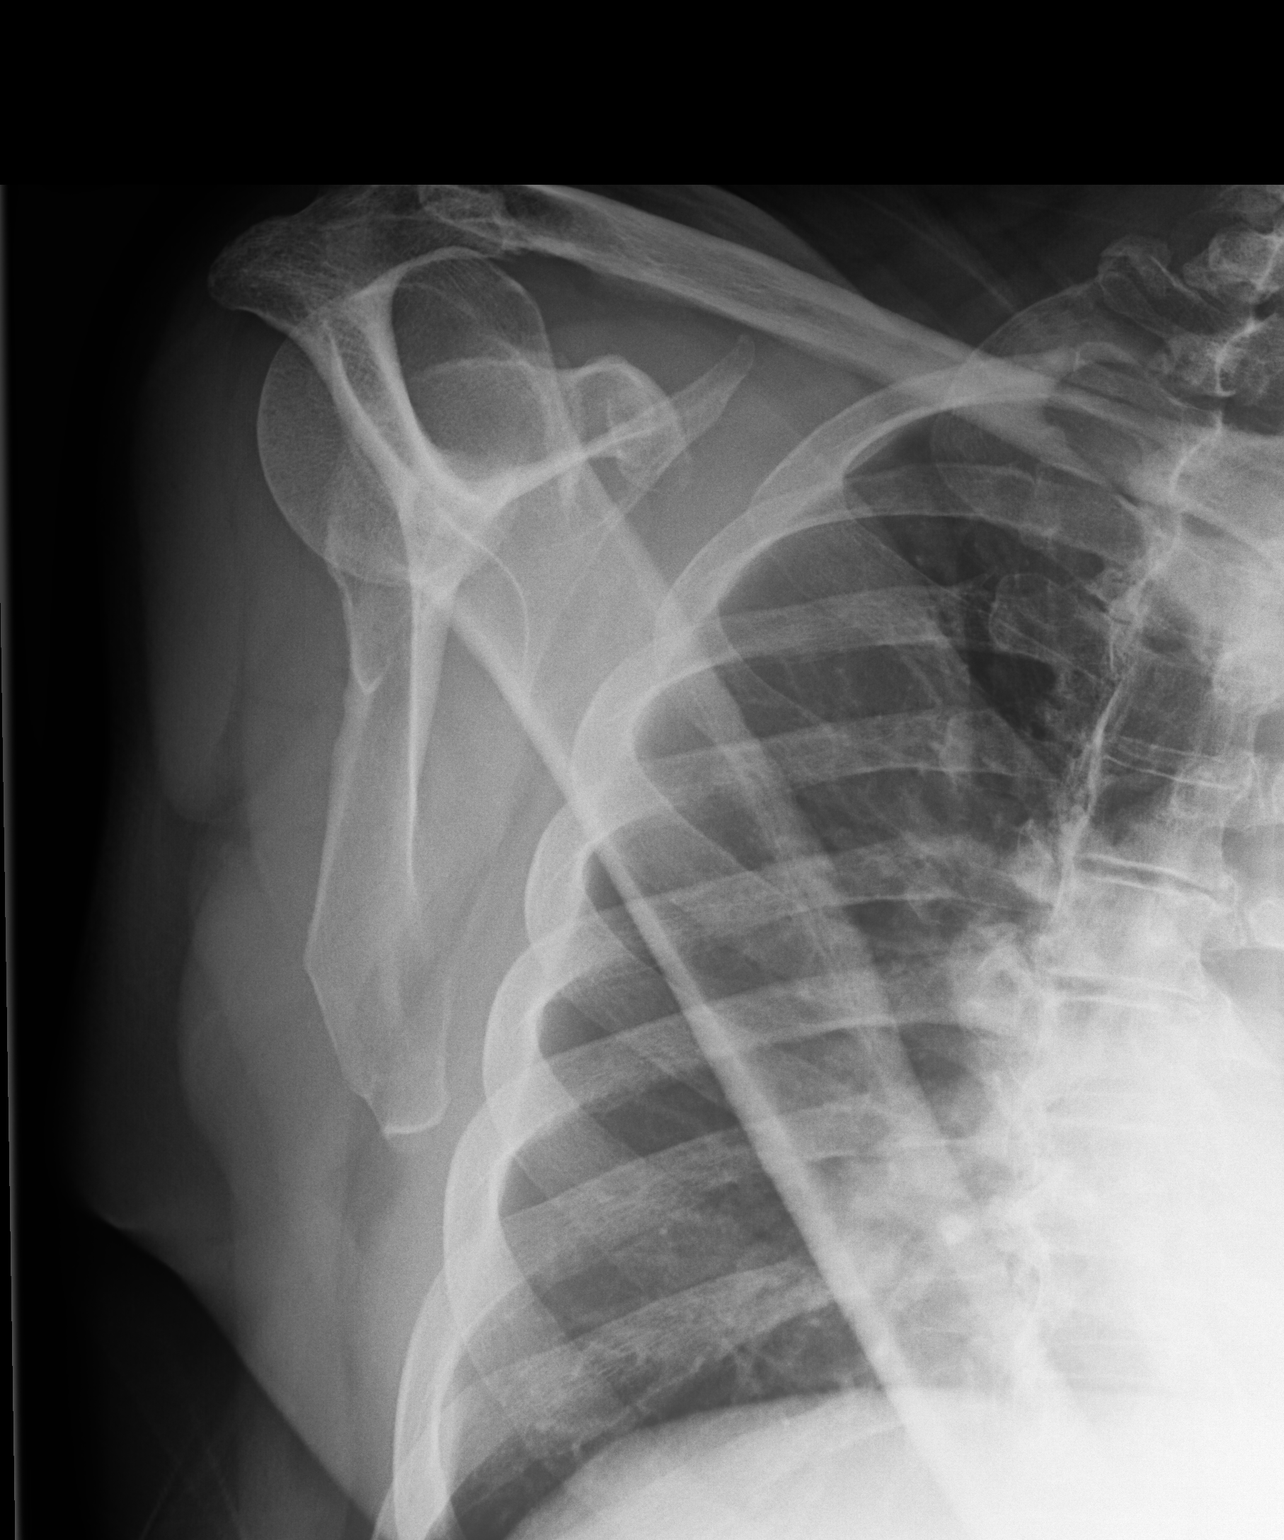

[2 of 2 positions shown; findings below may reference images not displayed]

FINDINGS: No fracture. No glenohumeral dislocation. No evidence of
acromioclavicular separation. No suspicious focal osseous lesions.
Minimal acromioclavicular and glenohumeral joint osteoarthritis. No
radiopaque foreign bodies or pathologic soft tissue calcifications.
IMPRESSION: Minimal acromioclavicular and glenohumeral joint osteoarthritis.

## 2020-09-21 DIAGNOSIS — E291 Testicular hypofunction: Secondary | ICD-10-CM | POA: Diagnosis not present

## 2020-09-21 DIAGNOSIS — R948 Abnormal results of function studies of other organs and systems: Secondary | ICD-10-CM | POA: Diagnosis not present

## 2020-09-28 DIAGNOSIS — R3121 Asymptomatic microscopic hematuria: Secondary | ICD-10-CM | POA: Diagnosis not present

## 2020-09-28 DIAGNOSIS — N35011 Post-traumatic bulbous urethral stricture: Secondary | ICD-10-CM | POA: Diagnosis not present

## 2020-09-28 DIAGNOSIS — N281 Cyst of kidney, acquired: Secondary | ICD-10-CM | POA: Diagnosis not present

## 2020-09-28 DIAGNOSIS — N5201 Erectile dysfunction due to arterial insufficiency: Secondary | ICD-10-CM | POA: Diagnosis not present

## 2020-09-28 DIAGNOSIS — E291 Testicular hypofunction: Secondary | ICD-10-CM | POA: Diagnosis not present

## 2020-10-24 DIAGNOSIS — H04123 Dry eye syndrome of bilateral lacrimal glands: Secondary | ICD-10-CM | POA: Diagnosis not present

## 2020-10-24 DIAGNOSIS — E785 Hyperlipidemia, unspecified: Secondary | ICD-10-CM | POA: Diagnosis not present

## 2020-10-24 DIAGNOSIS — H25813 Combined forms of age-related cataract, bilateral: Secondary | ICD-10-CM | POA: Diagnosis not present

## 2020-10-24 DIAGNOSIS — I10 Essential (primary) hypertension: Secondary | ICD-10-CM | POA: Diagnosis not present

## 2020-10-24 DIAGNOSIS — H401131 Primary open-angle glaucoma, bilateral, mild stage: Secondary | ICD-10-CM | POA: Diagnosis not present

## 2020-10-24 DIAGNOSIS — M130 Polyarthritis, unspecified: Secondary | ICD-10-CM | POA: Diagnosis not present

## 2020-10-24 DIAGNOSIS — H5015 Alternating exotropia: Secondary | ICD-10-CM | POA: Diagnosis not present

## 2020-11-13 ENCOUNTER — Ambulatory Visit: Payer: Medicare Other | Admitting: Adult Health

## 2020-12-23 DIAGNOSIS — I10 Essential (primary) hypertension: Secondary | ICD-10-CM | POA: Diagnosis not present

## 2020-12-23 DIAGNOSIS — M130 Polyarthritis, unspecified: Secondary | ICD-10-CM | POA: Diagnosis not present

## 2020-12-23 DIAGNOSIS — E785 Hyperlipidemia, unspecified: Secondary | ICD-10-CM | POA: Diagnosis not present

## 2020-12-25 DIAGNOSIS — M130 Polyarthritis, unspecified: Secondary | ICD-10-CM | POA: Diagnosis not present

## 2020-12-25 DIAGNOSIS — E782 Mixed hyperlipidemia: Secondary | ICD-10-CM | POA: Diagnosis not present

## 2020-12-25 DIAGNOSIS — R7309 Other abnormal glucose: Secondary | ICD-10-CM | POA: Diagnosis not present

## 2020-12-25 DIAGNOSIS — I252 Old myocardial infarction: Secondary | ICD-10-CM | POA: Diagnosis not present

## 2020-12-25 DIAGNOSIS — I10 Essential (primary) hypertension: Secondary | ICD-10-CM | POA: Diagnosis not present

## 2020-12-25 DIAGNOSIS — M1000 Idiopathic gout, unspecified site: Secondary | ICD-10-CM | POA: Diagnosis not present

## 2020-12-25 DIAGNOSIS — E291 Testicular hypofunction: Secondary | ICD-10-CM | POA: Diagnosis not present

## 2021-01-22 DIAGNOSIS — I10 Essential (primary) hypertension: Secondary | ICD-10-CM | POA: Diagnosis not present

## 2021-01-22 DIAGNOSIS — M130 Polyarthritis, unspecified: Secondary | ICD-10-CM | POA: Diagnosis not present

## 2021-01-22 DIAGNOSIS — E785 Hyperlipidemia, unspecified: Secondary | ICD-10-CM | POA: Diagnosis not present

## 2021-02-19 DIAGNOSIS — Z0000 Encounter for general adult medical examination without abnormal findings: Secondary | ICD-10-CM | POA: Diagnosis not present

## 2021-02-20 ENCOUNTER — Ambulatory Visit (INDEPENDENT_AMBULATORY_CARE_PROVIDER_SITE_OTHER): Payer: Medicare Other | Admitting: Adult Health

## 2021-02-20 ENCOUNTER — Encounter: Payer: Self-pay | Admitting: Adult Health

## 2021-02-20 ENCOUNTER — Other Ambulatory Visit: Payer: Self-pay

## 2021-02-20 VITALS — BP 132/74 | HR 71 | Ht 66.0 in | Wt 198.0 lb

## 2021-02-20 DIAGNOSIS — Z9989 Dependence on other enabling machines and devices: Secondary | ICD-10-CM | POA: Diagnosis not present

## 2021-02-20 DIAGNOSIS — G4733 Obstructive sleep apnea (adult) (pediatric): Secondary | ICD-10-CM | POA: Diagnosis not present

## 2021-02-20 NOTE — Progress Notes (Addendum)
PATIENT: Dustin Brown DOB: [birth year]  REASON FOR VISIT: follow up HISTORY FROM: patient  HISTORY OF PRESENT ILLNESS: Today 02/20/21:  Dustin Brown is a 70 year old male with a history of obstructive sleep apnea on CPAP.  He returns today for follow-up.  He states that his mask continues to leak a night.  He has tried changing out his supplies and making sure straps are tight but it still leaks.  That prohibits him from using it the entire night.  He returns today for follow-up.    HISTORY:  11/08/19: Dustin Brown is a 70 year old male with a history of obstructive sleep apnea on CPAP.  He returns today for follow-up.  His download indicates that he uses machine 25 out of 30 days for compliance of 83%.  He uses machine greater than 4 hours for 14 days for compliance of 47%.  On average he uses his machine 4 hours and 34 minutes.  His residual AHI is 5.9 on 13 cm of water with EPR of 1.  His leak in the 95th percentile is 24.3 L/min.  He reports that some nights he has a hard time falling asleep with the mask on.  He also states that sometimes his mask will leak if he does not have the straps tight enough.  He returns today for an evaluation.  REVIEW OF SYSTEMS: Out of a complete 14 system review of symptoms, the patient complains only of the following symptoms, and all other reviewed systems are negative.  FSS 15 ESS1  ALLERGIES: No Known Allergies  HOME MEDICATIONS: Outpatient Medications Prior to Visit  Medication Sig Dispense Refill  . allopurinol (ZYLOPRIM) 100 MG tablet Take 100 mg by mouth daily.    Marland Kitchen amLODipine (NORVASC) 5 MG tablet Take 5 mg by mouth daily.    . Cholecalciferol (VITAMIN D3 PO) Take by mouth.    . colchicine 0.6 MG tablet Take 0.6 mg by mouth daily.    . Ferrous Sulfate (IRON) 325 (65 Fe) MG TABS Take by mouth.    Marland Kitchen GLUCOSAMINE HCL PO Take by mouth 3 (three) times daily.    . hydrochlorothiazide (HYDRODIURIL) 25 MG tablet Take 25 mg by mouth daily.    .  Latanoprostene Bunod 0.024 % SOLN Apply to eye.    . rosuvastatin (CRESTOR) 20 MG tablet Take 20 mg by mouth daily.    Marland Kitchen UNABLE TO FIND OTC Testosterone from "health food store."    . verapamil (CALAN-SR) 240 MG CR tablet Take 240 mg by mouth 2 (two) times daily.     No facility-administered medications prior to visit.    PAST MEDICAL HISTORY: Past Medical History:  Diagnosis Date  . Gout   . Gout 06/24/2019  . HLD (hyperlipidemia) 06/24/2019  . HTN (hypertension) 06/24/2019  . Hyperlipidemia   . Hypertension     PAST SURGICAL HISTORY: No past surgical history on file.  FAMILY HISTORY: Family History  Problem Relation Age of Onset  . Heart attack Father   . Heart attack Brother        at 44     SOCIAL HISTORY: Social History   Socioeconomic History  . Marital status: Married    Spouse name: Not on file  . Number of children: 3  . Years of education: Not on file  . Highest education level: Not on file  Occupational History  . Not on file  Tobacco Use  . Smoking status: Current Every Day Smoker    Types: Cigars  .  Smokeless tobacco: Never Used  . Tobacco comment: 1 every 4-5 days   Vaping Use  . Vaping Use: Never used  Substance and Sexual Activity  . Alcohol use: Yes    Comment: occ  . Drug use: Not on file  . Sexual activity: Not on file  Other Topics Concern  . Not on file  Social History Narrative  . Not on file   Social Determinants of Health   Financial Resource Strain: Not on file  Food Insecurity: Not on file  Transportation Needs: Not on file  Physical Activity: Not on file  Stress: Not on file  Social Connections: Not on file  Intimate Partner Violence: Not on file      PHYSICAL EXAM  Vitals:   02/20/21 1451  BP: 132/74  Pulse: 71  Weight: 198 lb (89.8 kg)  Height: 5\' 6"  (1.676 m)   Body mass index is 31.96 kg/m.  Generalized: Well developed, in no acute distress  Chest: Lungs clear to auscultation bilaterally  Neurological  examination  Mentation: Alert oriented to time, place, history taking. Follows all commands speech and language fluent Cranial nerve II-XII: Extraocular movements were full, visual field were full on confrontational test Head turning and shoulder shrug  were normal and symmetric. Motor: The motor testing reveals 5 over 5 strength of all 4 extremities. Good symmetric motor tone is noted throughout.  Sensory: Sensory testing is intact to soft touch on all 4 extremities. No evidence of extinction is noted.  Gait and station: Gait is normal.    DIAGNOSTIC DATA (LABS, IMAGING, TESTING) - I reviewed patient records, labs, notes, testing and imaging myself where available.     ASSESSMENT AND PLAN 70 y.o. year old male  has a past medical history of Gout, Gout (06/24/2019), HLD (hyperlipidemia) (06/24/2019), HTN (hypertension) (06/24/2019), Hyperlipidemia, and Hypertension. here with:  1. OSA on CPAP  - CPAP compliance suboptimal - Good treatment of AHI  -Order sent for mask refitting - Encourage patient to use CPAP nightly and > 4 hours each night - F/U in 6 months or sooner if needed   I spent 20 minutes of face-to-face and non-face-to-face time with patient.  This included previsit chart review, lab review, study review, order entry, electronic health record documentation, patient education.  Ward Givens, MSN, NP-C 02/20/2021, 3:12 PM [facility] [address], [place], [place] [address] [phone]  I reviewed the above note and documentation by the Nurse Practitioner and agree with the history, exam, assessment and plan as outlined above. I was available for consultation. Star Age, MD, PhD [facility])

## 2021-02-20 NOTE — Patient Instructions (Signed)
Continue using CPAP nightly and greater than 4 hours each night Mask refitting ordered If your symptoms worsen or you develop new symptoms please let us know.   

## 2021-02-22 ENCOUNTER — Telehealth: Payer: Self-pay | Admitting: Adult Health

## 2021-02-22 NOTE — Telephone Encounter (Signed)
Sent message to [employer] (per [facility] NP if has not heard in 24 hours).  I called pt to let him know that sent message to them.  He appreciated call.

## 2021-02-22 NOTE — Telephone Encounter (Signed)
Pt called wanting to inform the Provider that the cpap company never reached out to him. Please advise.

## 2021-03-06 DIAGNOSIS — H25813 Combined forms of age-related cataract, bilateral: Secondary | ICD-10-CM | POA: Diagnosis not present

## 2021-03-06 DIAGNOSIS — H401131 Primary open-angle glaucoma, bilateral, mild stage: Secondary | ICD-10-CM | POA: Diagnosis not present

## 2021-03-06 DIAGNOSIS — H5015 Alternating exotropia: Secondary | ICD-10-CM | POA: Diagnosis not present

## 2021-04-24 DIAGNOSIS — M130 Polyarthritis, unspecified: Secondary | ICD-10-CM | POA: Diagnosis not present

## 2021-04-24 DIAGNOSIS — E785 Hyperlipidemia, unspecified: Secondary | ICD-10-CM | POA: Diagnosis not present

## 2021-04-24 DIAGNOSIS — I10 Essential (primary) hypertension: Secondary | ICD-10-CM | POA: Diagnosis not present

## 2021-04-29 DIAGNOSIS — Z1212 Encounter for screening for malignant neoplasm of rectum: Secondary | ICD-10-CM | POA: Diagnosis not present

## 2021-04-29 DIAGNOSIS — Z1211 Encounter for screening for malignant neoplasm of colon: Secondary | ICD-10-CM | POA: Diagnosis not present

## 2021-04-30 DIAGNOSIS — M109 Gout, unspecified: Secondary | ICD-10-CM | POA: Diagnosis not present

## 2021-04-30 DIAGNOSIS — I252 Old myocardial infarction: Secondary | ICD-10-CM | POA: Diagnosis not present

## 2021-04-30 DIAGNOSIS — M1000 Idiopathic gout, unspecified site: Secondary | ICD-10-CM | POA: Diagnosis not present

## 2021-04-30 DIAGNOSIS — R7309 Other abnormal glucose: Secondary | ICD-10-CM | POA: Diagnosis not present

## 2021-04-30 DIAGNOSIS — I10 Essential (primary) hypertension: Secondary | ICD-10-CM | POA: Diagnosis not present

## 2021-04-30 DIAGNOSIS — E782 Mixed hyperlipidemia: Secondary | ICD-10-CM | POA: Diagnosis not present

## 2021-04-30 DIAGNOSIS — E785 Hyperlipidemia, unspecified: Secondary | ICD-10-CM | POA: Diagnosis not present

## 2021-04-30 DIAGNOSIS — E291 Testicular hypofunction: Secondary | ICD-10-CM | POA: Diagnosis not present

## 2021-04-30 DIAGNOSIS — M130 Polyarthritis, unspecified: Secondary | ICD-10-CM | POA: Diagnosis not present

## 2021-07-25 DIAGNOSIS — M130 Polyarthritis, unspecified: Secondary | ICD-10-CM | POA: Diagnosis not present

## 2021-07-25 DIAGNOSIS — I10 Essential (primary) hypertension: Secondary | ICD-10-CM | POA: Diagnosis not present

## 2021-07-25 DIAGNOSIS — E785 Hyperlipidemia, unspecified: Secondary | ICD-10-CM | POA: Diagnosis not present

## 2021-07-31 DIAGNOSIS — H401131 Primary open-angle glaucoma, bilateral, mild stage: Secondary | ICD-10-CM | POA: Diagnosis not present

## 2021-07-31 DIAGNOSIS — H5015 Alternating exotropia: Secondary | ICD-10-CM | POA: Diagnosis not present

## 2021-07-31 DIAGNOSIS — H25813 Combined forms of age-related cataract, bilateral: Secondary | ICD-10-CM | POA: Diagnosis not present

## 2021-08-24 DIAGNOSIS — I10 Essential (primary) hypertension: Secondary | ICD-10-CM | POA: Diagnosis not present

## 2021-08-24 DIAGNOSIS — M130 Polyarthritis, unspecified: Secondary | ICD-10-CM | POA: Diagnosis not present

## 2021-08-24 DIAGNOSIS — E785 Hyperlipidemia, unspecified: Secondary | ICD-10-CM | POA: Diagnosis not present

## 2021-08-27 DIAGNOSIS — M130 Polyarthritis, unspecified: Secondary | ICD-10-CM | POA: Diagnosis not present

## 2021-08-27 DIAGNOSIS — E782 Mixed hyperlipidemia: Secondary | ICD-10-CM | POA: Diagnosis not present

## 2021-08-27 DIAGNOSIS — M549 Dorsalgia, unspecified: Secondary | ICD-10-CM | POA: Diagnosis not present

## 2021-08-27 DIAGNOSIS — M1000 Idiopathic gout, unspecified site: Secondary | ICD-10-CM | POA: Diagnosis not present

## 2021-08-27 DIAGNOSIS — R7309 Other abnormal glucose: Secondary | ICD-10-CM | POA: Diagnosis not present

## 2021-08-27 DIAGNOSIS — I10 Essential (primary) hypertension: Secondary | ICD-10-CM | POA: Diagnosis not present

## 2021-08-30 NOTE — Progress Notes (Signed)
PATIENT: Dustin Brown DOB: Dec 13, 1950  REASON FOR VISIT: follow up HISTORY FROM: patient   HISTORY OF PRESENT ILLNESS: Today 09/03/21 Dustin Brown is a 70 y.o. male with a history of OSA on CPAP, he is here today for a follow up. His download below suggest good compliance wearing his CPAP for 100% of the last 30 days. He wears the machine for >4 hours for 87% of that time. His pressure is set at 13 cmH2O with an AHI of 7.6. Dustin Brown states he tried the nasal pillow mask and he felt that it was worse than the full mask and he went back to his other full face mask. Patient request that he remains on this mask, he finds it to be more comfortable and the leak does not bother the patient.    HISTORY  02/20/21: Dustin Brown is a 70 year old male with a history of obstructive sleep apnea on CPAP.  He returns today for follow-up.  He states that his mask continues to leak a night.  He has tried changing out his supplies and making sure straps are tight but it still leaks.  That prohibits him from using it the entire night.  He returns today for follow-up   11/08/19: Dustin Brown is a 70 year old male with a history of obstructive sleep apnea on CPAP.  He returns today for follow-up.  His download indicates that he uses machine 25 out of 30 days for compliance of 83%.  He uses machine greater than 4 hours for 14 days for compliance of 47%.  On average he uses his machine 4 hours and 34 minutes.  His residual AHI is 5.9 on 13 cm of water with EPR of 1.  His leak in the 95th percentile is 24.3 L/min.  He reports that some nights he has a hard time falling asleep with the mask on.  He also states that sometimes his mask will leak if he does not have the straps tight enough.  He returns today for an evaluation.  REVIEW OF SYSTEMS: Out of a complete 14 system review of symptoms, the patient complains only of the following symptoms, and all other reviewed systems are negative.  FSS: 6 ESS: 2  ALLERGIES: No  Known Allergies  HOME MEDICATIONS: Outpatient Medications Prior to Visit  Medication Sig Dispense Refill   allopurinol (ZYLOPRIM) 100 MG tablet Take 100 mg by mouth daily.     amLODipine (NORVASC) 5 MG tablet Take 5 mg by mouth daily.     Cholecalciferol (VITAMIN D3 PO) Take by mouth.     colchicine 0.6 MG tablet Take 0.6 mg by mouth daily.     Ferrous Sulfate (IRON) 325 (65 Fe) MG TABS Take by mouth.     GLUCOSAMINE HCL PO Take by mouth 3 (three) times daily.     hydrochlorothiazide (HYDRODIURIL) 25 MG tablet Take 25 mg by mouth daily.     Latanoprostene Bunod 0.024 % SOLN Apply to eye.     rosuvastatin (CRESTOR) 20 MG tablet Take 20 mg by mouth daily.     UNABLE TO FIND OTC Testosterone from "health food store."     verapamil (CALAN-SR) 240 MG CR tablet Take 240 mg by mouth 2 (two) times daily.     No facility-administered medications prior to visit.    PAST MEDICAL HISTORY: Past Medical History:  Diagnosis Date   Gout    Gout 06/24/2019   HLD (hyperlipidemia) 06/24/2019   HTN (hypertension) 06/24/2019   Hyperlipidemia  Hypertension     PAST SURGICAL HISTORY: No past surgical history on file.  FAMILY HISTORY: Family History  Problem Relation Age of Onset   Heart attack Father    Heart attack Brother        at 54     SOCIAL HISTORY: Social History   Socioeconomic History   Marital status: Married    Spouse name: Not on file   Number of children: 3   Years of education: Not on file   Highest education level: Not on file  Occupational History   Not on file  Tobacco Use   Smoking status: Every Day    Types: Cigars   Smokeless tobacco: Never   Tobacco comments:    1 every 4-5 days   Vaping Use   Vaping Use: Never used  Substance and Sexual Activity   Alcohol use: Yes    Comment: occ   Drug use: Not on file   Sexual activity: Not on file  Other Topics Concern   Not on file  Social History Narrative   Not on file   Social Determinants of Health    Financial Resource Strain: Not on file  Food Insecurity: Not on file  Transportation Needs: Not on file  Physical Activity: Not on file  Stress: Not on file  Social Connections: Not on file  Intimate Partner Violence: Not on file      PHYSICAL EXAM  Vitals:   09/03/21 0857  BP: 137/68  Pulse: (!) 57  Weight: 85.6 kg  Height: 5\' 6"  (1.676 m)   Body mass index is 30.47 kg/m.  Generalized: Well developed, in no acute distress  Chest: Lungs clear to auscultation bilaterally  Neurological examination  Mentation: Alert oriented to time, place, history taking. Follows all commands speech and language fluent Cranial nerve II-XII: Extraocular movements were full, visual field were full on confrontational test Head turning and shoulder shrug  were normal and symmetric. Motor: The motor testing reveals 5 over 5 strength of all 4 extremities. Good symmetric motor tone is noted throughout.  Sensory: Sensory testing is intact to soft touch on all 4 extremities. No evidence of extinction is noted.  Gait and station: Gait is normal.    DIAGNOSTIC DATA (LABS, IMAGING, TESTING) - I reviewed patient records, labs, notes, testing and imaging myself where available.  No results found for: WBC, HGB, HCT, MCV, PLT    Component Value Date/Time   CREATININE 0.90 09/15/2017 0828   No results found for: CHOL, HDL, LDLCALC, LDLDIRECT, TRIG, CHOLHDL No results found for: HGBA1C No results found for: VITAMINB12 No results found for: TSH    ASSESSMENT AND PLAN 70 y.o. year old male  has a past medical history of Gout, Gout (06/24/2019), HLD (hyperlipidemia) (06/24/2019), HTN (hypertension) (06/24/2019), Hyperlipidemia, and Hypertension. here with:  OSA on CPAP  - CPAP compliance excellent - Fair treatment of AHI  - Encourage patient to use CPAP nightly and > 4 hours each night - F/U in 1 year or sooner if needed   Ward Givens, MSN, NP-C 09/03/2021, 9:06 AM [facility] [address], [place], [place] [address] [phone]

## 2021-09-03 ENCOUNTER — Encounter: Payer: Self-pay | Admitting: Adult Health

## 2021-09-03 ENCOUNTER — Ambulatory Visit (INDEPENDENT_AMBULATORY_CARE_PROVIDER_SITE_OTHER): Payer: Medicare Other | Admitting: Adult Health

## 2021-09-03 VITALS — BP 137/68 | HR 57 | Ht 66.0 in | Wt 188.8 lb

## 2021-09-03 DIAGNOSIS — G4733 Obstructive sleep apnea (adult) (pediatric): Secondary | ICD-10-CM

## 2021-09-03 DIAGNOSIS — Z9989 Dependence on other enabling machines and devices: Secondary | ICD-10-CM

## 2021-09-03 DIAGNOSIS — Z23 Encounter for immunization: Secondary | ICD-10-CM | POA: Diagnosis not present

## 2021-09-24 DIAGNOSIS — E785 Hyperlipidemia, unspecified: Secondary | ICD-10-CM | POA: Diagnosis not present

## 2021-09-24 DIAGNOSIS — I10 Essential (primary) hypertension: Secondary | ICD-10-CM | POA: Diagnosis not present

## 2021-09-24 DIAGNOSIS — M130 Polyarthritis, unspecified: Secondary | ICD-10-CM | POA: Diagnosis not present

## 2021-10-01 DIAGNOSIS — E291 Testicular hypofunction: Secondary | ICD-10-CM | POA: Diagnosis not present

## 2021-10-01 DIAGNOSIS — Z125 Encounter for screening for malignant neoplasm of prostate: Secondary | ICD-10-CM | POA: Diagnosis not present

## 2021-10-08 DIAGNOSIS — N5201 Erectile dysfunction due to arterial insufficiency: Secondary | ICD-10-CM | POA: Diagnosis not present

## 2021-10-08 DIAGNOSIS — E291 Testicular hypofunction: Secondary | ICD-10-CM | POA: Diagnosis not present

## 2021-10-08 DIAGNOSIS — R3915 Urgency of urination: Secondary | ICD-10-CM | POA: Diagnosis not present

## 2021-10-08 DIAGNOSIS — N401 Enlarged prostate with lower urinary tract symptoms: Secondary | ICD-10-CM | POA: Diagnosis not present

## 2021-10-24 DIAGNOSIS — M130 Polyarthritis, unspecified: Secondary | ICD-10-CM | POA: Diagnosis not present

## 2021-10-24 DIAGNOSIS — E785 Hyperlipidemia, unspecified: Secondary | ICD-10-CM | POA: Diagnosis not present

## 2021-10-24 DIAGNOSIS — I10 Essential (primary) hypertension: Secondary | ICD-10-CM | POA: Diagnosis not present

## 2021-10-30 DIAGNOSIS — H25813 Combined forms of age-related cataract, bilateral: Secondary | ICD-10-CM | POA: Diagnosis not present

## 2021-10-30 DIAGNOSIS — H401131 Primary open-angle glaucoma, bilateral, mild stage: Secondary | ICD-10-CM | POA: Diagnosis not present

## 2021-10-30 DIAGNOSIS — H5015 Alternating exotropia: Secondary | ICD-10-CM | POA: Diagnosis not present

## 2021-11-23 DIAGNOSIS — M130 Polyarthritis, unspecified: Secondary | ICD-10-CM | POA: Diagnosis not present

## 2021-11-23 DIAGNOSIS — E785 Hyperlipidemia, unspecified: Secondary | ICD-10-CM | POA: Diagnosis not present

## 2021-11-23 DIAGNOSIS — I10 Essential (primary) hypertension: Secondary | ICD-10-CM | POA: Diagnosis not present

## 2021-12-03 DIAGNOSIS — R195 Other fecal abnormalities: Secondary | ICD-10-CM | POA: Diagnosis not present

## 2021-12-23 DIAGNOSIS — I10 Essential (primary) hypertension: Secondary | ICD-10-CM | POA: Diagnosis not present

## 2021-12-23 DIAGNOSIS — M130 Polyarthritis, unspecified: Secondary | ICD-10-CM | POA: Diagnosis not present

## 2021-12-23 DIAGNOSIS — E785 Hyperlipidemia, unspecified: Secondary | ICD-10-CM | POA: Diagnosis not present

## 2022-01-16 DIAGNOSIS — M130 Polyarthritis, unspecified: Secondary | ICD-10-CM | POA: Diagnosis not present

## 2022-01-16 DIAGNOSIS — R7309 Other abnormal glucose: Secondary | ICD-10-CM | POA: Diagnosis not present

## 2022-01-16 DIAGNOSIS — I10 Essential (primary) hypertension: Secondary | ICD-10-CM | POA: Diagnosis not present

## 2022-01-16 DIAGNOSIS — E785 Hyperlipidemia, unspecified: Secondary | ICD-10-CM | POA: Diagnosis not present

## 2022-01-22 DIAGNOSIS — M130 Polyarthritis, unspecified: Secondary | ICD-10-CM | POA: Diagnosis not present

## 2022-01-22 DIAGNOSIS — E785 Hyperlipidemia, unspecified: Secondary | ICD-10-CM | POA: Diagnosis not present

## 2022-01-22 DIAGNOSIS — I10 Essential (primary) hypertension: Secondary | ICD-10-CM | POA: Diagnosis not present

## 2022-01-25 DIAGNOSIS — E291 Testicular hypofunction: Secondary | ICD-10-CM | POA: Diagnosis not present

## 2022-01-25 DIAGNOSIS — I10 Essential (primary) hypertension: Secondary | ICD-10-CM | POA: Diagnosis not present

## 2022-01-25 DIAGNOSIS — E782 Mixed hyperlipidemia: Secondary | ICD-10-CM | POA: Diagnosis not present

## 2022-01-25 DIAGNOSIS — R7309 Other abnormal glucose: Secondary | ICD-10-CM | POA: Diagnosis not present

## 2022-02-22 DIAGNOSIS — E785 Hyperlipidemia, unspecified: Secondary | ICD-10-CM | POA: Diagnosis not present

## 2022-02-22 DIAGNOSIS — I10 Essential (primary) hypertension: Secondary | ICD-10-CM | POA: Diagnosis not present

## 2022-03-04 DIAGNOSIS — Z1211 Encounter for screening for malignant neoplasm of colon: Secondary | ICD-10-CM | POA: Diagnosis not present

## 2022-03-04 DIAGNOSIS — D123 Benign neoplasm of transverse colon: Secondary | ICD-10-CM | POA: Diagnosis not present

## 2022-03-04 DIAGNOSIS — D121 Benign neoplasm of appendix: Secondary | ICD-10-CM | POA: Diagnosis not present

## 2022-03-04 DIAGNOSIS — R195 Other fecal abnormalities: Secondary | ICD-10-CM | POA: Diagnosis not present

## 2022-03-04 DIAGNOSIS — K5730 Diverticulosis of large intestine without perforation or abscess without bleeding: Secondary | ICD-10-CM | POA: Diagnosis not present

## 2022-03-04 DIAGNOSIS — D122 Benign neoplasm of ascending colon: Secondary | ICD-10-CM | POA: Diagnosis not present

## 2022-03-04 DIAGNOSIS — D127 Benign neoplasm of rectosigmoid junction: Secondary | ICD-10-CM | POA: Diagnosis not present

## 2022-03-07 DIAGNOSIS — D123 Benign neoplasm of transverse colon: Secondary | ICD-10-CM | POA: Diagnosis not present

## 2022-03-07 DIAGNOSIS — D121 Benign neoplasm of appendix: Secondary | ICD-10-CM | POA: Diagnosis not present

## 2022-03-07 DIAGNOSIS — D122 Benign neoplasm of ascending colon: Secondary | ICD-10-CM | POA: Diagnosis not present

## 2022-03-07 DIAGNOSIS — D127 Benign neoplasm of rectosigmoid junction: Secondary | ICD-10-CM | POA: Diagnosis not present

## 2022-03-18 DIAGNOSIS — K635 Polyp of colon: Secondary | ICD-10-CM | POA: Diagnosis not present

## 2022-03-24 DIAGNOSIS — E785 Hyperlipidemia, unspecified: Secondary | ICD-10-CM | POA: Diagnosis not present

## 2022-03-24 DIAGNOSIS — I10 Essential (primary) hypertension: Secondary | ICD-10-CM | POA: Diagnosis not present

## 2022-04-02 ENCOUNTER — Ambulatory Visit: Payer: Self-pay | Admitting: Surgery

## 2022-04-02 ENCOUNTER — Other Ambulatory Visit: Payer: Self-pay | Admitting: Surgery

## 2022-04-02 DIAGNOSIS — Z01818 Encounter for other preprocedural examination: Secondary | ICD-10-CM

## 2022-04-02 NOTE — Progress Notes (Signed)
Sent message, via epic in basket, requesting orders in epic from surgeon.  

## 2022-04-03 ENCOUNTER — Other Ambulatory Visit (HOSPITAL_COMMUNITY): Payer: Self-pay

## 2022-04-05 NOTE — Patient Instructions (Signed)
DUE TO COVID-19 ONLY TWO VISITORS  (aged 69 and older)  ARE ALLOWED TO COME WITH YOU AND STAY IN THE WAITING ROOM ONLY DURING PRE OP AND PROCEDURE.   ?**NO VISITORS ARE ALLOWED IN THE SHORT STAY AREA OR RECOVERY ROOM!!** ? ?IF YOU WILL BE ADMITTED INTO THE HOSPITAL YOU ARE ALLOWED ONLY FOUR SUPPORT PEOPLE DURING VISITATION HOURS ONLY (7 AM -8PM)   ?The support person(s) must pass our screening, gel in and out, and wear a mask at all times, including in the patient?s room. ?Patients must also wear a mask when staff or their support person are in the room. ?Visitors GUEST BADGE MUST BE WORN VISIBLY  ?One adult visitor may remain with you overnight and MUST be in the room by 8 P.M. ?  ? ? Your procedure is scheduled on: 04/17/22 ? ? Report to [facility] Main Entrance ? ?  Report to admitting at : 10:45 AM ? ? Call this number if you have problems the morning of surgery [phone] ? ? Clear liquids diet starting the day before surgery until : 9:45 AM DAY OF SURGERY. Please drink plenty fluids the day of the prep. ? ?Water ?Black Coffee (sugar ok, NO MILK/CREAM OR CREAMERS)  ?Tea (sugar ok, NO MILK/CREAM OR CREAMERS) regular and decaf                             ?Plain Jell-O (NO RED)                                           ?Fruit ices (not with fruit pulp, NO RED)                                     ?Popsicles (NO RED)                                                                  ?Juice: apple, WHITE grape, WHITE cranberry ?Sports drinks like Gatorade (NO RED) ?Clear broth(vegetable,chicken,beef) ?            ?DRINK 2 PRESURGERY ENSURE DRINKS THE NIGHT BEFORE SURGERY AT ? 1000 PM AND 1 PRESURGERY DRINK THE DAY OF THE PROCEDURE 3 HOURS PRIOR TO SCHEDULED SURGERY. NO SOLIDS AFTER MIDNIGHT THE DAY PRIOR TO THE SURGERY. NOTHING BY MOUTH EXCEPT CLEAR LIQUIDS UNTIL THREE HOURS PRIOR TO SCHEDULED SURGERY. PLEASE FINISH PRESURGERY ENSURE DRINK PER SURGEON ORDER 3 HOURS PRIOR TO SCHEDULED SURGERY TIME WHICH NEEDS  TO BE COMPLETED AT : 9:45 AM.   ?  ?The day of surgery:  ?Drink ONE (1) Pre-Surgery Clear Ensure or G2 at AM the morning of surgery. Drink in one sitting. Do not sip.  ?This drink was given to you during your hospital  ?pre-op appointment visit. ?Nothing else to drink after completing the  ?Pre-Surgery Clear Ensure or G2. ?  ?       If you have questions, please contact your surgeon?s office. ? ?FOLLOW BOWEL PREP AND ANY ADDITIONAL PRE OP INSTRUCTIONS YOU RECEIVED FROM YOUR  SURGEON'S OFFICE!!! ?  ?Oral Hygiene is also important to reduce your risk of infection.                                    ?Remember - BRUSH YOUR TEETH THE MORNING OF SURGERY WITH YOUR REGULAR TOOTHPASTE ? ? Do NOT smoke after Midnight ? ? Take these medicines the morning of surgery with A SIP OF WATER: amlodipine,allopurinol,colchicine,verapamil.Use eye drops as usual. ? ?DO NOT TAKE ANY ORAL DIABETIC MEDICATIONS DAY OF YOUR SURGERY ? ?Bring CPAP mask and tubing day of surgery. ?                  ?           You may not have any metal on your body including hair pins, jewelry, and body piercing ? ?           Do not wear lotions, powders, perfumes/cologne, or deodorant ? ?            Men may shave face and neck. ? ? Do not bring valuables to the hospital. [facility] NOT ?            RESPONSIBLE   FOR VALUABLES. ? ? Contacts, dentures or bridgework may not be worn into surgery. ? ? Bring small overnight bag day of surgery. ?  ? Patients discharged on the day of surgery will not be allowed to drive home.  Someone NEEDS to stay with you for the first 24 hours after anesthesia. ? ? Special Instructions: Bring a copy of your healthcare power of attorney and living will documents         the day of surgery if you haven't scanned them before. ? ?            Please read over the following fact sheets you were given: IF [place] [phone] ? ?   [facility] - Preparing for Surgery ?Before  surgery, you can play an important role.  Because skin is not sterile, your skin needs to be as free of germs as possible.  You can reduce the number of germs on your skin by washing with CHG (chlorahexidine gluconate) soap before surgery.  CHG is an antiseptic cleaner which kills germs and bonds with the skin to continue killing germs even after washing. ?Please DO NOT use if you have an allergy to CHG or antibacterial soaps.  If your skin becomes reddened/irritated stop using the CHG and inform your nurse when you arrive at Short Stay. ?Do not shave (including legs and underarms) for at least 48 hours prior to the first CHG shower.  You may shave your face/neck. ?Please follow these instructions carefully: ? 1.  Shower with CHG Soap the night before surgery and the  morning of Surgery. ? 2.  If you choose to wash your hair, wash your hair first as usual with your  normal  shampoo. ? 3.  After you shampoo, rinse your hair and body thoroughly to remove the  shampoo.                           4.  Use CHG as you would any other liquid soap.  You can apply chg directly  to the skin and wash  ?  Gently with a scrungie or clean washcloth. ? 5.  Apply the CHG Soap to your body ONLY FROM THE NECK DOWN.   Do not use on face/ open      ?                     Wound or open sores. Avoid contact with eyes, ears mouth and genitals (private parts).  ?                     Production manager,  Genitals (private parts) with your normal soap. ?            6.  Wash thoroughly, paying special attention to the area where your surgery  will be performed. ? 7.  Thoroughly rinse your body with warm water from the neck down. ? 8.  DO NOT shower/wash with your normal soap after using and rinsing off  the CHG Soap. ?               9.  Pat yourself dry with a clean towel. ?           10.  Wear clean pajamas. ?           11.  Place clean sheets on your bed the night of your first shower and do not  sleep with pets. ?Day of Surgery  : ?Do not apply any lotions/deodorants the morning of surgery.  Please wear clean clothes to the hospital/surgery center. ? ?FAILURE TO FOLLOW THESE INSTRUCTIONS MAY RESULT IN THE CANCELLATION OF YOUR SURGERY ?PATIENT SIGNATURE_________________________________ ? ?NURSE SIGNATURE__________________________________ ? ?________________________________________________________________________  ? ?Incentive Spirometer ? ?An incentive spirometer is a tool that can help keep your lungs clear and active. This tool measures how well you are filling your lungs with each breath. Taking long deep breaths may help reverse or decrease the chance of developing breathing (pulmonary) problems (especially infection) following: ?A long period of time when you are unable to move or be active. ?BEFORE THE PROCEDURE  ?If the spirometer includes an indicator to show your best effort, your nurse or respiratory therapist will set it to a desired goal. ?If possible, sit up straight or lean slightly forward. Try not to slouch. ?Hold the incentive spirometer in an upright position. ?INSTRUCTIONS FOR USE  ?Sit on the edge of your bed if possible, or sit up as far as you can in bed or on a chair. ?Hold the incentive spirometer in an upright position. ?Breathe out normally. ?Place the mouthpiece in your mouth and seal your lips tightly around it. ?Breathe in slowly and as deeply as possible, raising the piston or the ball toward the top of the column. ?Hold your breath for 3-5 seconds or for as long as possible. Allow the piston or ball to fall to the bottom of the column. ?Remove the mouthpiece from your mouth and breathe out normally. ?Rest for a few seconds and repeat Steps 1 through 7 at least 10 times every 1-2 hours when you are awake. Take your time and take a few normal breaths between deep breaths. ?The spirometer may include an indicator to show your best effort. Use the indicator as a goal to work toward during each repetition. ?After  each set of 10 deep breaths, practice coughing to be sure your lungs are clear. If you have an incision (the cut made at the time of surgery), support your incision when coughing by placing a pillow or rolled up

## 2022-04-08 ENCOUNTER — Encounter (HOSPITAL_COMMUNITY)
Admission: RE | Admit: 2022-04-08 | Discharge: 2022-04-08 | Disposition: A | Discharge status: Home / Self Care | Payer: Medicare Other | Source: Ambulatory Visit | Attending: Surgery | Admitting: Surgery

## 2022-04-08 ENCOUNTER — Encounter (HOSPITAL_COMMUNITY): Payer: Self-pay

## 2022-04-08 VITALS — BP 149/73 | HR 58 | Temp 98.2°F | Ht 66.0 in | Wt 179.0 lb

## 2022-04-08 DIAGNOSIS — Z01818 Encounter for other preprocedural examination: Secondary | ICD-10-CM | POA: Diagnosis not present

## 2022-04-08 DIAGNOSIS — I10 Essential (primary) hypertension: Secondary | ICD-10-CM | POA: Insufficient documentation

## 2022-04-08 HISTORY — DX: Unspecified osteoarthritis, unspecified site: M19.90

## 2022-04-08 HISTORY — DX: Anemia, unspecified: D64.9

## 2022-04-08 LAB — COMPREHENSIVE METABOLIC PANEL
ALT: 29 U/L (ref 0–44)
AST: 27 U/L (ref 15–41)
Albumin: 4.2 g/dL (ref 3.5–5.0)
Alkaline Phosphatase: 83 U/L (ref 38–126)
Anion gap: 10 (ref 5–15)
BUN: 16 mg/dL (ref 8–23)
CO2: 28 mmol/L (ref 22–32)
Calcium: 9.6 mg/dL (ref 8.9–10.3)
Chloride: 100 mmol/L (ref 98–111)
Creatinine, Ser: 0.7 mg/dL (ref 0.61–1.24)
GFR, Estimated: >60 mL/min (ref >60)
Glucose, Bld: 104 mg/dL — ABNORMAL HIGH (ref 70–99)
Potassium: 3.3 mmol/L — ABNORMAL LOW (ref 3.5–5.1)
Sodium: 138 mmol/L (ref 135–145)
Total Bilirubin: 0.6 mg/dL (ref 0.3–1.2)
Total Protein: 7.2 g/dL (ref 6.5–8.1)

## 2022-04-08 LAB — CBC WITH DIFFERENTIAL/PLATELET
Abs Immature Granulocytes: 0.02 10*3/uL (ref 0.00–0.07)
Basophils Absolute: 0 10*3/uL (ref 0.0–0.1)
Basophils Relative: 1 %
Eosinophils Absolute: 0.1 10*3/uL (ref 0.0–0.5)
Eosinophils Relative: 1 %
HCT: 40.2 % (ref 39.0–52.0)
Hemoglobin: 13.5 g/dL (ref 13.0–17.0)
Immature Granulocytes: 0 %
Lymphocytes Relative: 31 %
Lymphs Abs: 1.8 10*3/uL (ref 0.7–4.0)
MCH: 31 pg (ref 26.0–34.0)
MCHC: 33.6 g/dL (ref 30.0–36.0)
MCV: 92.4 fL (ref 80.0–100.0)
Monocytes Absolute: 0.6 10*3/uL (ref 0.1–1.0)
Monocytes Relative: 10 %
Neutro Abs: 3.3 10*3/uL (ref 1.7–7.7)
Neutrophils Relative %: 57 %
Platelets: 204 10*3/uL (ref 150–400)
RBC: 4.35 MIL/uL (ref 4.22–5.81)
RDW: 13.8 % (ref 11.5–15.5)
WBC: 5.8 10*3/uL (ref 4.0–10.5)
nRBC: 0 % (ref 0.0–0.2)

## 2022-04-08 NOTE — Progress Notes (Signed)
For Short Stay: ?[place] appointment date: ?Date of COVID positive in last 90 days: ? ?Bowel Prep reminder: ? ? ?For Anesthesia: ?PCP - Dr. Lucianne Lei. LOV: 01/25/22 ?Cardiologist -  ? ?Chest x-ray -  ?EKG -  ?Stress Test -  ?ECHO -  ?Cardiac Cath -  ?Pacemaker/ICD device last checked: ?Pacemaker orders received: ?Device Rep notified: ? ?Spinal Cord Stimulator: ? ?Sleep Study - Yes ?CPAP - Yes ? ?Fasting Blood Sugar -  ?Checks Blood Sugar _____ times a day ?Date and result of last Hgb A1c- ? ?Blood Thinner Instructions: ?Aspirin Instructions: ?Last Dose: ? ?Activity level: Can go up a flight of stairs and activities of daily living without stopping and without chest pain and/or shortness of breath ?  Able to exercise without chest pain and/or shortness of breath ?  Unable to go up a flight of stairs without chest pain and/or shortness of breath ?   ? ?Anesthesia review: Hx: Smoker,HTN,OSA(CPAP) ? ?Patient denies shortness of breath, fever, cough and chest pain at PAT appointment ? ? ?Patient verbalized understanding of instructions that were given to them at the PAT appointment. Patient was also instructed that they will need to review over the PAT instructions again at home before surgery.  ?

## 2022-04-17 ENCOUNTER — Encounter (HOSPITAL_COMMUNITY): Payer: Self-pay | Admitting: Surgery

## 2022-04-17 ENCOUNTER — Inpatient Hospital Stay (HOSPITAL_COMMUNITY)
Admission: RE | Admit: 2022-04-17 | Discharge: 2022-04-21 | DRG: 330 | Disposition: A | Discharge status: Home / Self Care | Payer: Medicare Other | Source: Ambulatory Visit | Attending: Surgery | Admitting: Surgery

## 2022-04-17 ENCOUNTER — Other Ambulatory Visit: Payer: Self-pay

## 2022-04-17 ENCOUNTER — Encounter (HOSPITAL_COMMUNITY)
Admission: RE | Disposition: A | Discharge status: Home / Self Care | Payer: Self-pay | Source: Ambulatory Visit | Attending: Surgery

## 2022-04-17 ENCOUNTER — Inpatient Hospital Stay (HOSPITAL_COMMUNITY): Payer: Medicare Other | Admitting: Anesthesiology

## 2022-04-17 DIAGNOSIS — I10 Essential (primary) hypertension: Secondary | ICD-10-CM | POA: Diagnosis present

## 2022-04-17 DIAGNOSIS — M109 Gout, unspecified: Secondary | ICD-10-CM | POA: Diagnosis present

## 2022-04-17 DIAGNOSIS — G4733 Obstructive sleep apnea (adult) (pediatric): Secondary | ICD-10-CM | POA: Diagnosis present

## 2022-04-17 DIAGNOSIS — E785 Hyperlipidemia, unspecified: Secondary | ICD-10-CM | POA: Diagnosis present

## 2022-04-17 DIAGNOSIS — K420 Umbilical hernia with obstruction, without gangrene: Secondary | ICD-10-CM | POA: Diagnosis present

## 2022-04-17 DIAGNOSIS — D122 Benign neoplasm of ascending colon: Principal | ICD-10-CM | POA: Diagnosis present

## 2022-04-17 DIAGNOSIS — Z9049 Acquired absence of other specified parts of digestive tract: Principal | ICD-10-CM

## 2022-04-17 DIAGNOSIS — K6389 Other specified diseases of intestine: Secondary | ICD-10-CM | POA: Diagnosis not present

## 2022-04-17 DIAGNOSIS — K567 Ileus, unspecified: Secondary | ICD-10-CM | POA: Diagnosis not present

## 2022-04-17 DIAGNOSIS — K635 Polyp of colon: Secondary | ICD-10-CM | POA: Diagnosis not present

## 2022-04-17 DIAGNOSIS — K429 Umbilical hernia without obstruction or gangrene: Secondary | ICD-10-CM | POA: Diagnosis not present

## 2022-04-17 HISTORY — PX: UMBILICAL HERNIA REPAIR: SHX196

## 2022-04-17 HISTORY — PX: LAPAROSCOPIC RIGHT HEMI COLECTOMY: SHX5926

## 2022-04-17 LAB — TYPE AND SCREEN
ABO/RH(D): A POS
Antibody Screen: NEGATIVE

## 2022-04-17 LAB — ABO/RH: ABO/RH(D): A POS

## 2022-04-17 SURGERY — LAPAROSCOPIC RIGHT HEMI COLECTOMY
Anesthesia: General | Laterality: Right

## 2022-04-17 MED ORDER — ATORVASTATIN CALCIUM 10 MG PO TABS
10.0000 mg | ORAL_TABLET | Freq: Every day | ORAL | Status: DC
  Administered 2022-04-18 – 2022-04-21 (×4): 10 mg via ORAL
  Filled 2022-04-17 (×4): qty 1

## 2022-04-17 MED ORDER — AMLODIPINE BESYLATE 5 MG PO TABS
5.0000 mg | ORAL_TABLET | Freq: Every day | ORAL | Status: DC
  Administered 2022-04-18 – 2022-04-21 (×4): 5 mg via ORAL
  Filled 2022-04-17 (×4): qty 1

## 2022-04-17 MED ORDER — ORAL CARE MOUTH RINSE: 15.0000 mL | Freq: Once | OROMUCOSAL | Status: AC

## 2022-04-17 MED ORDER — ROCURONIUM BROMIDE 10 MG/ML (PF) SYRINGE
PREFILLED_SYRINGE | INTRAVENOUS | Status: AC
  Filled 2022-04-17: qty 10

## 2022-04-17 MED ORDER — KETAMINE HCL 10 MG/ML IJ SOLN
INTRAMUSCULAR | Status: DC | PRN
  Administered 2022-04-17: 35 mg via INTRAVENOUS

## 2022-04-17 MED ORDER — ALVIMOPAN 12 MG PO CAPS
12.0000 mg | ORAL_CAPSULE | ORAL | Status: AC
  Administered 2022-04-17: 12 mg via ORAL
  Filled 2022-04-17: qty 1

## 2022-04-17 MED ORDER — SODIUM CHLORIDE 0.9 % IR SOLN
Status: DC | PRN
  Administered 2022-04-17: 2000 mL

## 2022-04-17 MED ORDER — DEXAMETHASONE SODIUM PHOSPHATE 10 MG/ML IJ SOLN
INTRAMUSCULAR | Status: AC
  Filled 2022-04-17: qty 1

## 2022-04-17 MED ORDER — HYDRALAZINE HCL 20 MG/ML IJ SOLN: 10.0000 mg | INTRAMUSCULAR | Status: DC | PRN

## 2022-04-17 MED ORDER — SUGAMMADEX SODIUM 200 MG/2ML IV SOLN
INTRAVENOUS | Status: DC | PRN
  Administered 2022-04-17: 200 mg via INTRAVENOUS

## 2022-04-17 MED ORDER — NEOMYCIN SULFATE 500 MG PO TABS: 1000.0000 mg | ORAL_TABLET | ORAL | Status: DC

## 2022-04-17 MED ORDER — LIDOCAINE HCL 2 % IJ SOLN
INTRAMUSCULAR | Status: AC
  Filled 2022-04-17: qty 20

## 2022-04-17 MED ORDER — FENTANYL CITRATE (PF) 250 MCG/5ML IJ SOLN
INTRAMUSCULAR | Status: AC
  Filled 2022-04-17: qty 5

## 2022-04-17 MED ORDER — DIPHENHYDRAMINE HCL 50 MG/ML IJ SOLN: 12.5000 mg | Freq: Four times a day (QID) | INTRAMUSCULAR | Status: DC | PRN

## 2022-04-17 MED ORDER — TRAMADOL HCL 50 MG PO TABS
50.0000 mg | ORAL_TABLET | Freq: Four times a day (QID) | ORAL | Status: DC | PRN
  Administered 2022-04-17: 50 mg via ORAL
  Filled 2022-04-17: qty 1

## 2022-04-17 MED ORDER — IBUPROFEN 400 MG PO TABS: 600.0000 mg | ORAL_TABLET | Freq: Four times a day (QID) | ORAL | Status: DC | PRN

## 2022-04-17 MED ORDER — ONDANSETRON HCL 4 MG PO TABS
4.0000 mg | ORAL_TABLET | Freq: Four times a day (QID) | ORAL | Status: DC | PRN
  Administered 2022-04-18: 4 mg via ORAL
  Filled 2022-04-17: qty 1

## 2022-04-17 MED ORDER — HEPARIN SODIUM (PORCINE) 5000 UNIT/ML IJ SOLN
5000.0000 [IU] | Freq: Once | INTRAMUSCULAR | Status: AC
  Administered 2022-04-17: 5000 [IU] via SUBCUTANEOUS
  Filled 2022-04-17: qty 1

## 2022-04-17 MED ORDER — DIPHENHYDRAMINE HCL 12.5 MG/5ML PO ELIX: 12.5000 mg | ORAL_SOLUTION | Freq: Four times a day (QID) | ORAL | Status: DC | PRN

## 2022-04-17 MED ORDER — VERAPAMIL HCL ER 240 MG PO TBCR
240.0000 mg | EXTENDED_RELEASE_TABLET | Freq: Two times a day (BID) | ORAL | Status: DC
  Administered 2022-04-17 – 2022-04-20 (×6): 240 mg via ORAL
  Filled 2022-04-17 (×9): qty 1

## 2022-04-17 MED ORDER — HEPARIN SODIUM (PORCINE) 5000 UNIT/ML IJ SOLN
5000.0000 [IU] | Freq: Three times a day (TID) | INTRAMUSCULAR | Status: DC
  Administered 2022-04-17 – 2022-04-21 (×12): 5000 [IU] via SUBCUTANEOUS
  Filled 2022-04-17 (×11): qty 1

## 2022-04-17 MED ORDER — ONDANSETRON HCL 4 MG/2ML IJ SOLN
INTRAMUSCULAR | Status: DC | PRN
Start: 2022-04-17 — End: 2022-04-17
  Administered 2022-04-17: 4 mg via INTRAVENOUS

## 2022-04-17 MED ORDER — FERROUS SULFATE 325 (65 FE) MG PO TABS
325.0000 mg | ORAL_TABLET | Freq: Every day | ORAL | Status: DC
  Administered 2022-04-18 – 2022-04-21 (×4): 325 mg via ORAL
  Filled 2022-04-17 (×4): qty 1

## 2022-04-17 MED ORDER — DEXAMETHASONE SODIUM PHOSPHATE 10 MG/ML IJ SOLN
INTRAMUSCULAR | Status: DC | PRN
  Administered 2022-04-17: 5 mg via INTRAVENOUS

## 2022-04-17 MED ORDER — LIDOCAINE HCL (PF) 2 % IJ SOLN
INTRAMUSCULAR | Status: AC
  Filled 2022-04-17: qty 5

## 2022-04-17 MED ORDER — ALVIMOPAN 12 MG PO CAPS
12.0000 mg | ORAL_CAPSULE | Freq: Two times a day (BID) | ORAL | Status: DC
  Administered 2022-04-18 – 2022-04-20 (×4): 12 mg via ORAL
  Filled 2022-04-17 (×6): qty 1

## 2022-04-17 MED ORDER — METRONIDAZOLE 500 MG PO TABS: 1000.0000 mg | ORAL_TABLET | ORAL | Status: DC

## 2022-04-17 MED ORDER — LATANOPROST 0.005 % OP SOLN
1.0000 [drp] | Freq: Every day | OPHTHALMIC | Status: DC
  Administered 2022-04-17 – 2022-04-20 (×4): 1 [drp] via OPHTHALMIC
  Filled 2022-04-17: qty 2.5

## 2022-04-17 MED ORDER — BUPIVACAINE LIPOSOME 1.3 % IJ SUSP
INTRAMUSCULAR | Status: AC
  Filled 2022-04-17: qty 20

## 2022-04-17 MED ORDER — ENSURE SURGERY PO LIQD
237.0000 mL | Freq: Two times a day (BID) | ORAL | Status: DC
  Administered 2022-04-18 – 2022-04-20 (×3): 237 mL via ORAL

## 2022-04-17 MED ORDER — MIDAZOLAM HCL 2 MG/2ML IJ SOLN
INTRAMUSCULAR | Status: DC | PRN
  Administered 2022-04-17: 2 mg via INTRAVENOUS

## 2022-04-17 MED ORDER — ROCURONIUM BROMIDE 10 MG/ML (PF) SYRINGE
PREFILLED_SYRINGE | INTRAVENOUS | Status: DC | PRN
  Administered 2022-04-17: 80 mg via INTRAVENOUS

## 2022-04-17 MED ORDER — COLCHICINE 0.6 MG PO TABS
0.6000 mg | ORAL_TABLET | Freq: Every day | ORAL | Status: DC
  Administered 2022-04-18 – 2022-04-21 (×4): 0.6 mg via ORAL
  Filled 2022-04-17 (×4): qty 1

## 2022-04-17 MED ORDER — BUPIVACAINE-EPINEPHRINE (PF) 0.25% -1:200000 IJ SOLN
INTRAMUSCULAR | Status: AC
  Filled 2022-04-17: qty 30

## 2022-04-17 MED ORDER — BUPIVACAINE-EPINEPHRINE 0.25% -1:200000 IJ SOLN
INTRAMUSCULAR | Status: DC | PRN
  Administered 2022-04-17: 30 mL

## 2022-04-17 MED ORDER — MIDAZOLAM HCL 2 MG/2ML IJ SOLN
INTRAMUSCULAR | Status: AC
  Filled 2022-04-17: qty 2

## 2022-04-17 MED ORDER — ENSURE PRE-SURGERY PO LIQD: 296.0000 mL | Freq: Once | ORAL | Status: DC

## 2022-04-17 MED ORDER — ACETAMINOPHEN 500 MG PO TABS
1000.0000 mg | ORAL_TABLET | ORAL | Status: AC
  Administered 2022-04-17: 1000 mg via ORAL
  Filled 2022-04-17: qty 2

## 2022-04-17 MED ORDER — ENSURE PRE-SURGERY PO LIQD: 592.0000 mL | Freq: Once | ORAL | Status: DC

## 2022-04-17 MED ORDER — BISACODYL 5 MG PO TBEC: 20.0000 mg | DELAYED_RELEASE_TABLET | Freq: Once | ORAL | Status: DC

## 2022-04-17 MED ORDER — LACTATED RINGERS IV SOLN: INTRAVENOUS | Status: DC

## 2022-04-17 MED ORDER — PROPOFOL 10 MG/ML IV BOLUS
INTRAVENOUS | Status: DC | PRN
Start: 2022-04-17 — End: 2022-04-17
  Administered 2022-04-17: 50 mg via INTRAVENOUS
  Administered 2022-04-17: 150 mg via INTRAVENOUS

## 2022-04-17 MED ORDER — CHLORHEXIDINE GLUCONATE CLOTH 2 % EX PADS: 6.0000 | MEDICATED_PAD | Freq: Once | CUTANEOUS | Status: DC

## 2022-04-17 MED ORDER — POLYETHYLENE GLYCOL 3350 17 GM/SCOOP PO POWD: 1.0000 | Freq: Once | ORAL | Status: DC

## 2022-04-17 MED ORDER — LACTATED RINGERS IR SOLN
Status: DC | PRN
  Administered 2022-04-17: 1000 mL

## 2022-04-17 MED ORDER — SIMETHICONE 80 MG PO CHEW: 40.0000 mg | CHEWABLE_TABLET | Freq: Four times a day (QID) | ORAL | Status: DC | PRN

## 2022-04-17 MED ORDER — BUPIVACAINE LIPOSOME 1.3 % IJ SUSP
INTRAMUSCULAR | Status: DC | PRN
  Administered 2022-04-17: 20 mL

## 2022-04-17 MED ORDER — SODIUM CHLORIDE 0.9 % IV SOLN
2.0000 g | INTRAVENOUS | Status: AC
  Administered 2022-04-17: 2 g via INTRAVENOUS
  Filled 2022-04-17: qty 2

## 2022-04-17 MED ORDER — ONDANSETRON HCL 4 MG/2ML IJ SOLN: 4.0000 mg | Freq: Once | INTRAMUSCULAR | Status: DC | PRN

## 2022-04-17 MED ORDER — ALUM & MAG HYDROXIDE-SIMETH 200-200-20 MG/5ML PO SUSP: 30.0000 mL | Freq: Four times a day (QID) | ORAL | Status: DC | PRN

## 2022-04-17 MED ORDER — ALLOPURINOL 100 MG PO TABS
100.0000 mg | ORAL_TABLET | Freq: Two times a day (BID) | ORAL | Status: DC
  Administered 2022-04-18 – 2022-04-21 (×7): 100 mg via ORAL
  Filled 2022-04-17 (×7): qty 1

## 2022-04-17 MED ORDER — ACETAMINOPHEN 500 MG PO TABS
1000.0000 mg | ORAL_TABLET | Freq: Four times a day (QID) | ORAL | Status: DC
  Administered 2022-04-17 – 2022-04-20 (×9): 1000 mg via ORAL
  Filled 2022-04-17 (×11): qty 2

## 2022-04-17 MED ORDER — EPHEDRINE SULFATE-NACL 50-0.9 MG/10ML-% IV SOSY
PREFILLED_SYRINGE | INTRAVENOUS | Status: DC | PRN
  Administered 2022-04-17: 10 mg via INTRAVENOUS

## 2022-04-17 MED ORDER — PHENYLEPHRINE 80 MCG/ML (10ML) SYRINGE FOR IV PUSH (FOR BLOOD PRESSURE SUPPORT)
PREFILLED_SYRINGE | INTRAVENOUS | Status: AC
  Filled 2022-04-17: qty 10

## 2022-04-17 MED ORDER — HYDROMORPHONE HCL 1 MG/ML IJ SOLN: 0.2500 mg | INTRAMUSCULAR | Status: DC | PRN

## 2022-04-17 MED ORDER — ONDANSETRON HCL 4 MG/2ML IJ SOLN
4.0000 mg | Freq: Four times a day (QID) | INTRAMUSCULAR | Status: DC | PRN
  Administered 2022-04-19 – 2022-04-20 (×2): 4 mg via INTRAVENOUS
  Filled 2022-04-17 (×2): qty 2

## 2022-04-17 MED ORDER — MEPERIDINE HCL 50 MG/ML IJ SOLN: 6.2500 mg | INTRAMUSCULAR | Status: DC | PRN

## 2022-04-17 MED ORDER — HYDROCHLOROTHIAZIDE 25 MG PO TABS
25.0000 mg | ORAL_TABLET | Freq: Every day | ORAL | Status: DC
  Administered 2022-04-18 – 2022-04-21 (×4): 25 mg via ORAL
  Filled 2022-04-17 (×4): qty 1

## 2022-04-17 MED ORDER — KETAMINE HCL 10 MG/ML IJ SOLN
INTRAMUSCULAR | Status: AC
  Filled 2022-04-17: qty 1

## 2022-04-17 MED ORDER — CHLORHEXIDINE GLUCONATE 0.12 % MT SOLN
15.0000 mL | Freq: Once | OROMUCOSAL | Status: AC
  Administered 2022-04-17: 15 mL via OROMUCOSAL

## 2022-04-17 MED ORDER — HYDROMORPHONE HCL 1 MG/ML IJ SOLN: 0.5000 mg | INTRAMUSCULAR | Status: DC | PRN

## 2022-04-17 MED ORDER — LIDOCAINE 2% (20 MG/ML) 5 ML SYRINGE
INTRAMUSCULAR | Status: DC | PRN
  Administered 2022-04-17: 100 mg via INTRAVENOUS

## 2022-04-17 MED ORDER — LIDOCAINE 20MG/ML (2%) 15 ML SYRINGE OPTIME
INTRAMUSCULAR | Status: DC | PRN
Start: 2022-04-17 — End: 2022-04-17
  Administered 2022-04-17: 1.5 mg/kg/h via INTRAVENOUS

## 2022-04-17 MED ORDER — FENTANYL CITRATE (PF) 250 MCG/5ML IJ SOLN
INTRAMUSCULAR | Status: DC | PRN
  Administered 2022-04-17 (×2): 100 ug via INTRAVENOUS

## 2022-04-17 MED ORDER — ONDANSETRON HCL 4 MG/2ML IJ SOLN
INTRAMUSCULAR | Status: AC
  Filled 2022-04-17: qty 2

## 2022-04-17 MED ORDER — BUPIVACAINE LIPOSOME 1.3 % IJ SUSP: 20.0000 mL | Freq: Once | INTRAMUSCULAR | Status: DC

## 2022-04-17 SURGICAL SUPPLY — 73 items
APPLIER CLIP ROT 10 11.4 M/L (STAPLE)
BAG COUNTER SPONGE SURGICOUNT (BAG) IMPLANT
BLADE CLIPPER SURG (BLADE) IMPLANT
CABLE HIGH FREQUENCY MONO STRZ (ELECTRODE) ×4 IMPLANT
CELLS DAT CNTRL 66122 CELL SVR (MISCELLANEOUS) IMPLANT
CHLORAPREP W/TINT 26 (MISCELLANEOUS) ×2 IMPLANT
CLIP APPLIE ROT 10 11.4 M/L (STAPLE) IMPLANT
COVER SURGICAL LIGHT HANDLE (MISCELLANEOUS) ×3 IMPLANT
DERMABOND ADVANCED (GAUZE/BANDAGES/DRESSINGS) ×1
DERMABOND ADVANCED .7 DNX12 (GAUZE/BANDAGES/DRESSINGS) ×2 IMPLANT
DISSECTOR BLUNT TIP ENDO 5MM (MISCELLANEOUS) IMPLANT
DRSG OPSITE POSTOP 4X10 (GAUZE/BANDAGES/DRESSINGS) ×1 IMPLANT
ELECT REM PT RETURN 15FT ADLT (MISCELLANEOUS) ×3 IMPLANT
GAUZE SPONGE 4X4 12PLY STRL (GAUZE/BANDAGES/DRESSINGS) ×2 IMPLANT
GLOVE BIO SURGEON STRL SZ7.5 (GLOVE) ×6 IMPLANT
GLOVE ECLIPSE 8.0 STRL XLNG CF (GLOVE) ×4 IMPLANT
GLOVE INDICATOR 8.0 STRL GRN (GLOVE) ×3 IMPLANT
GOWN STRL REUS W/ TWL XL LVL3 (GOWN DISPOSABLE) ×8 IMPLANT
GOWN STRL REUS W/TWL XL LVL3 (GOWN DISPOSABLE) ×12
IRRIG SUCT STRYKERFLOW 2 WTIP (MISCELLANEOUS) ×3
IRRIGATION SUCT STRKRFLW 2 WTP (MISCELLANEOUS) IMPLANT
KIT BASIN OR (CUSTOM PROCEDURE TRAY) ×2 IMPLANT
KIT TURNOVER KIT A (KITS) ×1 IMPLANT
LIGASURE IMPACT 36 18CM CVD LR (INSTRUMENTS) IMPLANT
NEEDLE HYPO 22GX1.5 SAFETY (NEEDLE) IMPLANT
NS IRRIG 1000ML POUR BTL (IV SOLUTION) ×3 IMPLANT
PACK COLON (CUSTOM PROCEDURE TRAY) ×3 IMPLANT
PAD POSITIONING PINK XL (MISCELLANEOUS) IMPLANT
PENCIL SMOKE EVACUATOR (MISCELLANEOUS) IMPLANT
PROTECTOR NERVE ULNAR (MISCELLANEOUS) ×1 IMPLANT
RELOAD PROXIMATE 75MM BLUE (ENDOMECHANICALS) ×6 IMPLANT
RELOAD STAPLE 75 3.8 BLU REG (ENDOMECHANICALS) IMPLANT
RETRACTOR WND ALEXIS 18 MED (MISCELLANEOUS) IMPLANT
RTRCTR WOUND ALEXIS 18CM MED (MISCELLANEOUS)
SCISSORS LAP 5X35 DISP (ENDOMECHANICALS) ×3 IMPLANT
SEALER TISSUE G2 STRG ARTC 35C (ENDOMECHANICALS) ×1 IMPLANT
SET TUBE SMOKE EVAC HIGH FLOW (TUBING) ×3 IMPLANT
SHEARS HARMONIC ACE PLUS 36CM (ENDOMECHANICALS) IMPLANT
SLEEVE ADV FIXATION 5X100MM (TROCAR) ×9 IMPLANT
SPIKE FLUID TRANSFER (MISCELLANEOUS) ×2 IMPLANT
STAPLER 90 3.5 STAND SLIM (STAPLE) ×3
STAPLER 90 3.5 STD SLIM (STAPLE) IMPLANT
STAPLER GUN LINEAR PROX 60 (STAPLE) IMPLANT
STAPLER PROXIMATE 75MM BLUE (STAPLE) ×1 IMPLANT
STAPLER VISISTAT 35W (STAPLE) ×2 IMPLANT
SUT ETHIBOND 0 MO6 C/R (SUTURE) ×2 IMPLANT
SUT MNCRL AB 4-0 PS2 18 (SUTURE) ×3 IMPLANT
SUT PDS AB 1 CT1 27 (SUTURE) ×6 IMPLANT
SUT PROLENE 2 0 CT2 30 (SUTURE) IMPLANT
SUT PROLENE 2 0 KS (SUTURE) IMPLANT
SUT SILK 2 0 (SUTURE) ×3
SUT SILK 2 0 SH CR/8 (SUTURE) ×3 IMPLANT
SUT SILK 2-0 18XBRD TIE 12 (SUTURE) ×2 IMPLANT
SUT SILK 3 0 (SUTURE) ×3
SUT SILK 3 0 SH CR/8 (SUTURE) ×3 IMPLANT
SUT SILK 3-0 18XBRD TIE 12 (SUTURE) ×2 IMPLANT
SUT VIC AB 3-0 SH 27 (SUTURE)
SUT VIC AB 3-0 SH 27X BRD (SUTURE) IMPLANT
SYR 20ML LL LF (SYRINGE) IMPLANT
SYS LAPSCP GELPORT 120MM (MISCELLANEOUS)
SYS WOUND ALEXIS 18CM MED (MISCELLANEOUS) ×3
SYSTEM LAPSCP GELPORT 120MM (MISCELLANEOUS) IMPLANT
SYSTEM WOUND ALEXIS 18CM MED (MISCELLANEOUS) ×2 IMPLANT
TAPE CLOTH 4X10 WHT NS (GAUZE/BANDAGES/DRESSINGS) IMPLANT
TOWEL OR 17X26 10 PK STRL BLUE (TOWEL DISPOSABLE) ×3 IMPLANT
TOWEL OR NON WOVEN STRL DISP B (DISPOSABLE) ×3 IMPLANT
TRAY FOLEY MTR SLVR 14FR STAT (SET/KITS/TRAYS/PACK) ×3 IMPLANT
TRAY FOLEY MTR SLVR 16FR STAT (SET/KITS/TRAYS/PACK) ×1 IMPLANT
TROCAR ADV FIXATION 5X100MM (TROCAR) ×3 IMPLANT
TROCAR KII 12X100 BLADELESS (ENDOMECHANICALS) ×3 IMPLANT
TROCAR XCEL NON-BLD 11X100MML (ENDOMECHANICALS) IMPLANT
TUBING CONNECTING 10 (TUBING) ×6 IMPLANT
YANKAUER SUCT BULB TIP NO VENT (SUCTIONS) ×3 IMPLANT

## 2022-04-17 NOTE — H&P (Signed)
CC: Here today for surgery  HPI: Dustin Brown is an 71 y.o. male with history of HTN, HLD, gout, whom is seen in the office today as a referral by Dr. Cristina Gong for evaluation of colon polyps.  Colonoscopy for a positive Cologuard test was completed with Dr. Cristina Gong 03/04/22 demonstrated: 1. 18 mm polyp in the appendiceal orifice that was semipedunculated with biopsies being taken, left in situ. 2. 4 mm polyp in the transverse colon 3. Large polyp, 4 cm one third circumference in the ascending colon. Sessile without ulceration or retraction. Superficial biopsies were obtained. 4. Multiple sessile/semisessile polyps in the ascending colon. 5. 2 cm polyp in the mid transverse colon. This was lifted, " questionable complete removal." Area injected and adjacent area tattooed. 6. 11 mm polyp in the rectosigmoid was removed.  Path -pathology from transverse colon, appendiceal orifice, ascending colon, rectosigmoid colon all returned with tubular adenoma. Pathology of the the ascending colon polyp, mid, returned tubulovillous adenoma.  INTERVAL HX He denies any changes in his health or health history. Tolerated bowel prep with satisfactory result. States he is ready for surgery.   PMH: HTN, HLD, gout  PSH: He denies any prior abdominal or pelvic surgical history  FHx: Denies any known family history of colorectal, breast, endometrial or ovarian cancer  Social Hx: Smokes ~1 cigar per week. 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 Denies use of illicit drug. He works in a Veterinary surgeon. He is here today with his wife who works in a coffee shop at [employer].  Past Medical History:  Diagnosis Date   Anemia    Arthritis    Gout    Gout 06/24/2019   HLD (hyperlipidemia) 06/24/2019   HTN (hypertension) 06/24/2019   Hyperlipidemia    Hypertension     Past Surgical History:  Procedure Laterality Date   CIRCUMCISION      Family History  Problem Relation Age of Onset   Heart  attack Father    Heart attack Brother        at 76     Social:  reports that he has been smoking cigars. He has never used smokeless tobacco. He reports current alcohol use of about 5.0 standard drinks per week. No history on file for drug use.  Allergies: No Known Allergies  Medications: I have reviewed the patient's current medications.  No results found for this or any previous visit (from the past 48 hour(s)).  No results found.  ROS - all of the below systems have been reviewed with the patient and positives are indicated with bold text General: chills, fever or night sweats Eyes: blurry vision or double vision ENT: epistaxis or sore throat Allergy/Immunology: itchy/watery eyes or nasal congestion Hematologic/Lymphatic: bleeding problems, blood clots or swollen lymph nodes Endocrine: temperature intolerance or unexpected weight changes Breast: new or changing breast lumps or nipple discharge Resp: cough, shortness of breath, or wheezing CV: chest pain or dyspnea on exertion GI: as per HPI GU: dysuria, trouble voiding, or hematuria MSK: joint pain or joint stiffness Neuro: TIA or stroke symptoms Derm: pruritus and skin lesion changes Psych: anxiety and depression  PE Blood pressure (!) 153/69, pulse (!) 51, temperature 97.6 F (36.4 C), temperature source Oral, resp. rate 14, height '5\' 6"'$  (1.676 m), weight 81.2 kg, SpO2 100 %. Constitutional: NAD; conversant Eyes: Moist conjunctiva; no lid lag; anicteric Lungs: Normal respiratory effort CV: RRR GI: Abd soft, NT/ND; no palpable hepatosplenomegaly MSK: Normal range of motion of extremities  Psychiatric: Appropriate affect; alert and oriented x3  No results found for this or any previous visit (from the past 48 hour(s)).  No results found.  A/P: Dustin Brown is an 72 y.o. male with hx of HTN, HLD, gout, here for evaluation of endoscopically unresectable colon polyps found during colonscopy with Dr. Cristina Gong for  +cologuard. 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  -Distalmost polyp that was questionably removed and demonstrated adenomatous change was marked with a tattoo. The other additional endoscopically found polyps that were not removable are in the right side of the colon- appendiceal orifice as well as the ascending colon. All biopsies thus far have demonstrated no dysplasia or malignancy but adenomatous changes.  -The anatomy and physiology of the GI tract was reviewed with the patient. The pathophysiology of colon polyps was discussed as well with associated pictures. -We have discussed various different treatment options going forward including surgery (the most definitive) to address this -laparoscopic right hemicolectomy; primary repair of umbilical hernia. -We discussed primary repair of the umbilical hernia to prevent any sort of early postoperative bowel obstruction. We would not plan to use mesh at this time however for potential infectious risks with regards to his concurrent colectomy procedure. -The planned procedures, material risks (including, but not limited to, pain, bleeding, infection, scarring, need for blood transfusion, damage to surrounding structures- blood vessels/nerves/viscus/organs, damage to ureter, urine leak, leak from anastomosis, need for additional procedures, scenarios where a stoma may be necessary and where it may be permanent, worsening of pre-existing medical conditions, hernia, recurrence of umbilical hernia, recurrence of polyps/cancer, pneumonia, heart attack, stroke, death) benefits and alternatives to surgery were discussed at length. The patient's questions were answered to his satisfaction, he voiced understanding and elected to proceed with surgery. Additionally, we discussed typical postoperative expectations and the recovery process.  Nadeen Landau, [facility], [place]

## 2022-04-17 NOTE — Anesthesia Procedure Notes (Signed)
Procedure Name: Intubation Date/Time: 04/17/2022 12:01 PM Performed by: Lollie Sails, CRNA Pre-anesthesia Checklist: Patient identified, Emergency Drugs available, Suction available, Patient being monitored and Timeout performed Patient Re-evaluated:Patient Re-evaluated prior to induction Oxygen Delivery Method: Circle system utilized Preoxygenation: Pre-oxygenation with 100% oxygen Induction Type: IV induction Ventilation: Two handed mask ventilation required Laryngoscope Size: Miller and 3 Grade View: Grade I Tube type: Oral Tube size: 7.5 mm Number of attempts: 1 Airway Equipment and Method: Stylet Placement Confirmation: ETT inserted through vocal cords under direct vision, positive ETCO2 and breath sounds checked- equal and bilateral Secured at: 23 cm Tube secured with: Tape Dental Injury: Teeth and Oropharynx as per pre-operative assessment

## 2022-04-17 NOTE — Anesthesia Preprocedure Evaluation (Addendum)
Anesthesia Evaluation  Patient identified by MRN, date of birth, ID band Patient awake    Reviewed: Allergy & Precautions, NPO status , Patient's Chart, lab work & pertinent test results  Airway Mallampati: I       Dental  (+) Edentulous Upper, Edentulous Lower   Pulmonary sleep apnea and Continuous Positive Airway Pressure Ventilation , Current Smoker and Patient abstained from smoking.,    Pulmonary exam normal        Cardiovascular hypertension, Pt. on medications Normal cardiovascular exam     Neuro/Psych negative neurological ROS  negative psych ROS   GI/Hepatic negative GI ROS, Neg liver ROS,   Endo/Other  negative endocrine ROS  Renal/GU   negative genitourinary   Musculoskeletal   Abdominal Normal abdominal exam  (+)   Peds  Hematology negative hematology ROS (+)   Anesthesia Other Findings   Reproductive/Obstetrics                            Anesthesia Physical Anesthesia Plan  ASA: 2  Anesthesia Plan: General   Post-op Pain Management:    Induction: Intravenous  PONV Risk Score and Plan: 2 and Ondansetron, Dexamethasone and Midazolam  Airway Management Planned: Oral ETT  Additional Equipment: None  Intra-op Plan:   Post-operative Plan: Extubation in OR  Informed Consent: I have reviewed the patients History and Physical, chart, labs and discussed the procedure including the risks, benefits and alternatives for the proposed anesthesia with the patient or authorized representative who has indicated his/her understanding and acceptance.     Dental advisory given  Plan Discussed with: CRNA  Anesthesia Plan Comments:         Anesthesia Quick Evaluation

## 2022-04-17 NOTE — Transfer of Care (Signed)
Immediate Anesthesia Transfer of Care Note  Patient: Dustin Brown  Procedure(s) Performed: LAPAROSCOPIC RIGHT HEMI COLECTOMY (Right) OPEN UMBILICAL HERNIA REPAIR  Patient Location: PACU  Anesthesia Type:General  Level of Consciousness: awake, alert  and patient cooperative  Airway & Oxygen Therapy: Patient Spontanous Breathing and Patient connected to face mask oxygen  Post-op Assessment: Report given to RN and Post -op Vital signs reviewed and stable  Post vital signs: Reviewed and stable  Last Vitals:  Vitals Value Taken Time  BP 156/76 04/17/22 1351  Temp    Pulse 59 04/17/22 1352  Resp 24 04/17/22 1352  SpO2 100 % 04/17/22 1352  Vitals shown include unvalidated device data.  Last Pain:  Vitals:   04/17/22 1109  TempSrc:   PainSc: 0-No pain         Complications: No notable events documented.

## 2022-04-17 NOTE — Anesthesia Postprocedure Evaluation (Signed)
Anesthesia Post Note  Patient: Dustin Brown  Procedure(s) Performed: LAPAROSCOPIC RIGHT HEMI COLECTOMY (Right) OPEN UMBILICAL HERNIA REPAIR     Patient location during evaluation: PACU Anesthesia Type: General Level of consciousness: awake Pain management: pain level controlled Vital Signs Assessment: post-procedure vital signs reviewed and stable Respiratory status: spontaneous breathing Cardiovascular status: stable Postop Assessment: no apparent nausea or vomiting Anesthetic complications: no   No notable events documented.  Last Vitals:  Vitals:   04/17/22 1445 04/17/22 1534  BP: 139/78 (!) 156/77  Pulse: (!) 58 (!) 57  Resp: 14 16  Temp:  (!) 36.4 C  SpO2: 98% 100%    Last Pain:  Vitals:   04/17/22 1534  TempSrc: Axillary  PainSc: 3                  John F Lugene Beougher Jr

## 2022-04-17 NOTE — Op Note (Addendum)
PATIENT: Dustin Brown  71 y.o. male  Patient Care Team: Lucianne Lei, MD as PCP - General (Family Medicine)  PREOP DIAGNOSIS: Ascending colon mass  POSTOP DIAGNOSIS: Same  PROCEDURE:  Laparoscopic right hemicolectomy Primary repair of incarcerated fat containing umbilical hernia (2 x 2 cm in size)  SURGEON: Sharon Mt. Chaniya Genter, MD  ASSISTANT: Ralene Ok, MD  ANESTHESIA: General endotracheal  EBL: 75 mL Total I/O In: 100 [IV Piggyback:100] Out: 64 [Urine:335; Blood:75]  DRAINS: None  SPECIMEN: Right colon (including terminal ileum, appendix, ascending and proximal transverse colon en bloc  COUNTS: Sponge, needle and instrument counts were reported correct x2  FINDINGS:  Tattoo evident in the distal ascending colon at the level of the hepatic flexure.  Normal-appearing peritoneal surfaces.  Normal-appearing liver surface.  No evident masses.  Ultimately, palpable masslike structure at the ileocecal valve.  We opted to perform a right hemicolectomy and with the specimen include the tattooed portion of colon given the questionable complete resection noted at the time of his colonoscopy. Laparoscopic right hemicolectomy carried out.  He did have a 2 x 2 cm umbilical hernia which contained incarcerated preperitoneal fat..  There was an obvious defect entering the peritoneal cavity.  So as to prevent any sort of early postoperative bowel obstruction related to anything that gets incarcerated within this, we opted to perform a primary repair.  We were able to access this hernia defect through his extraction site and repaired it with PDS suture   NARRATIVE:  The patient was identified & brought into the operating room, placed supine on the operating table and SCDs were applied to the lower extremities. General endotracheal anesthesia was induced. The patient was positioned supine with arms tucked. Antibiotics were administered. A foley catheter was placed under sterile conditions.  Hair in the region of planned surgery was clipped. The abdomen was prepped and draped in a sterile fashion. A timeout was performed confirming our patient and plan.   Beginning with the extraction port, a supraumbilical incision was made and carried down to the midline fascia. This was then incised with electrocautery. The peritoneum was identified and elevated between clamps and carefully opened sharply.  He has an incarcerated fat-containing umbilical hernia that is approximately 2 x 2 cm in size.  This is inferior to our extraction site.  It contains incarcerated preperitoneal fat and there is a gap in the peritoneum at this level.  We are able to access this through our extraction incision by elevating the abdominal wall.  I was able to reduce and remove a fair amount of preperitoneal fat from the defect.  Again, there is a full-thickness hole which will likely place him at increased risk for an acute postoperative bowel obstruction if anything were to get incarcerated in this.  A small Alexis wound protector with a cap and associated port was then placed. The abdomen was insufflated to 15 mmHg with CO2. A laparoscope was placed and camera inspection revealed no evidence of injury. Bilateral TAP blocks were then performed under laparoscopic visualization using a mixture of 0.25% marcaine with epinepherine + Exparel. 3 additional ports were then placed under direct laparoscopic visualization - two in the left hemiabdomen and one in the right abdomen. The abdomen was surveyed. The liver and peritoneum appeared normal.  There were no signs of metastatic disease.  He was positioned in trendelenburg with left side down. The ileocolic pedicle was identified.  There is a fair amount of visceral fat which makes exposure of a medial  to lateral type approach difficult.  We therefore opted to pursue with a lateral to medial approach.  The cecum was grasped and retracted medially.  The Malikhi Ogan line of Toldt was incised  and the associated colon and mesocolon mobilized medially all the way up to the level of the hepatic flexure.  Right lower quadrant attachments of the appendix and terminal ileum were also freed from the underlying retroperitoneal tissue, working to stay clear of the right gonadal vessels and ureter.  He was repositioned in reverse Trendelenburg.  We then retracted the omentum anteriorly and the transverse colon inferiorly.  The lesser sac was gained to the level of the mid transverse colon.  The omentum was freed from the transverse colon all the way out to the level of hepatic flexure.  The hepatic flexure was then fully mobilized from this approach.  The duodenum was identified during her mobilization and protected free of injury.  The associated mesocolon was freed from the anterior surface of the lateral duodenum.  Mesentery is friable due to weight on the mesentery. We therefore opted to plan ileocolic vessel ligation extracorporeally.  The cecum easily reaches in the left upper quadrant out any difficulty.  All attachments from the underside of the mesocolon are freed.  There is a tattoo evident within the distalmost ascending colon near the hepatic flexure.  Given the questionable polyp resection at the time of his endoscopy at this location, we opted to include this in our planned resection which would be a right hemicolectomy.  At this point, the abdomen was desufflated and the terminal ileum and right colon delivered through the wound protector. Towels are placed around the field. The distal ileum was then transected using a GIA blue load stapler.  A Babcock was placed on the proximal ileal staple line to assist in maintaining orientation.  We then identified our distal point of transection at the level of the transverse colon where the right branch of the middle colic pedicle was identified.  We included the right branch of the middle colic's with the specimen.  A window was created in the  mesentery at this level.  The colon was then divided with a GIA blue load stapler.  Including the right branch of the middle colic's, the mesentery was then ligated using the Enseal device.  The ileocolic pedicle was taken near its origin again taking care to steer clear of the duodenum prior to vessel ligation and division.  The ileocolic pedicle stump was inspected and hemostatic.  The specimen was then passed off. Attention was turned to creating the anastomosis. The distal ileum and transverse colon were inspected for orientation to ensure no twisting nor bowel included in the mesenteric defect. An anastomosis was created between the distal ileum and the transverse colon using a 75 mm GIA blue load stapler. The staple line was inspected and noted to be hemostatic.  The common enterotomy channel was closed using a TA 90 blue load stapler. Hemostasis was achieved at the staple line using 3-0 silk U-stitches. 3-0 silk sutures were used to imbricate the corners of the staple line as well.  A 2-0 silk suture was placed securing the "apex" of the anastomosis. The anastomosis was palpated and noted to be widely patent. Contents are milked to and fro and no extravasation is noted.  This was then placed back into the abdomen. The abdomen was then irrigated with sterile saline and hemostasis verified. The omentum was then brought down over the anastomosis. The wound  protector cap was replaced and CO2 reinsufflated. The laparoscopic ports were removed under direct visualization and the sites noted to be hemostatic. The Alexis wound protector was removed, counts were reported correct, and we switched to clean instruments, gowns and drapes.   The umbilical fascia is defined with electrocautery, freeing surrounding hernia sac.  This is then closed using #1 PDS suture.  This obliterates the umbilical defect.    The extraction site fascia was then closed using two running #1 PDS sutures tied centrally.  The skin of all  incision sites was closed with 4-0 monocryl subcuticular suture. Dermabond was placed on the port sites and a sterile dressing was placed over the abdominal incision. All counts were reported correct. The patient was then awakened from anesthesia and sent to the post anesthesia care unit in stable condition.   DISPOSITION: PACU in satisfactory condition

## 2022-04-18 ENCOUNTER — Encounter (HOSPITAL_COMMUNITY): Payer: Self-pay | Admitting: Surgery

## 2022-04-18 ENCOUNTER — Other Ambulatory Visit (HOSPITAL_COMMUNITY): Payer: Self-pay

## 2022-04-18 LAB — BASIC METABOLIC PANEL
Anion gap: 8 (ref 5–15)
BUN: 6 mg/dL — ABNORMAL LOW (ref 8–23)
CO2: 25 mmol/L (ref 22–32)
Calcium: 9.4 mg/dL (ref 8.9–10.3)
Chloride: 105 mmol/L (ref 98–111)
Creatinine, Ser: 0.74 mg/dL (ref 0.61–1.24)
GFR, Estimated: >60 mL/min (ref >60)
Glucose, Bld: 135 mg/dL — ABNORMAL HIGH (ref 70–99)
Potassium: 4.5 mmol/L (ref 3.5–5.1)
Sodium: 138 mmol/L (ref 135–145)

## 2022-04-18 LAB — CBC
HCT: 36.9 % — ABNORMAL LOW (ref 39.0–52.0)
Hemoglobin: 12.5 g/dL — ABNORMAL LOW (ref 13.0–17.0)
MCH: 31.2 pg (ref 26.0–34.0)
MCHC: 33.9 g/dL (ref 30.0–36.0)
MCV: 92 fL (ref 80.0–100.0)
Platelets: 220 10*3/uL (ref 150–400)
RBC: 4.01 MIL/uL — ABNORMAL LOW (ref 4.22–5.81)
RDW: 13.6 % (ref 11.5–15.5)
WBC: 7.2 10*3/uL (ref 4.0–10.5)
nRBC: 0 % (ref 0.0–0.2)

## 2022-04-18 MED ORDER — TRAMADOL HCL 50 MG PO TABS
50.0000 mg | ORAL_TABLET | Freq: Four times a day (QID) | ORAL | 0 refills | Status: AC | PRN
Start: 2022-04-18 — End: 2022-04-23
  Filled 2022-04-18: qty 15, 4d supply, fill #0

## 2022-04-18 NOTE — Plan of Care (Signed)

## 2022-04-18 NOTE — Discharge Instructions (Signed)
POST OP INSTRUCTIONS AFTER COLON SURGERY  DIET: Be sure to include lots of fluids daily to stay hydrated - 64oz of water per day (8, 8 oz glasses).  Avoid fast food or heavy meals for the first couple of weeks as your are more likely to get nauseated. Avoid raw/uncooked fruits or vegetables for the first 4 weeks (its ok to have these if they are blended into smoothie form). If you have fruits/vegetables, make sure they are cooked until soft enough to mash on the roof of your mouth and chew your food well. Otherwise, diet as tolerated.  Take your usually prescribed home medications unless otherwise directed.  PAIN CONTROL: Pain is best controlled by a usual combination of three different methods TOGETHER: Ice/Heat Over the counter pain medication Prescription pain medication Most patients will experience some swelling and bruising around the surgical site.  Ice packs or heating pads (30-60 minutes up to 6 times a day) will help. Some people prefer to use ice alone, heat alone, alternating between ice & heat.  Experiment to what works for you.  Swelling and bruising can take several weeks to resolve.   It is helpful to take an over-the-counter pain medication regularly for the first few weeks: Ibuprofen (Motrin/Advil) - 200mg tabs - take 3 tabs (600mg) every 6 hours as needed for pain (unless you have been directed previously to avoid NSAIDs/ibuprofen) Acetaminophen (Tylenol) - you may take 650mg every 6 hours as needed. You can take this with motrin as they act differently on the body. If you are taking a narcotic pain medication that has acetaminophen in it, do not take over the counter tylenol at the same time. NOTE: You may take both of these medications together - most patients  find it most helpful when alternating between the two (i.e. Ibuprofen at 6am, tylenol at 9am, ibuprofen at 12pm ...) A  prescription for pain medication should be given to you upon discharge.  Take your pain medication as  prescribed if your pain is not adequatly controlled with the over-the-counter pain reliefs mentioned above.  Avoid getting constipated.  Between the surgery and the pain medications, it is common to experience some constipation.  Increasing fluid intake and taking a fiber supplement (such as Metamucil, Citrucel, FiberCon, MiraLax, etc) 1-2 times a day regularly will usually help prevent this problem from occurring.  A mild laxative (prune juice, Milk of Magnesia, MiraLax, etc) should be taken according to package directions if there are no bowel movements after 48 hours.    Dressing: Your incisions are covered in Dermabond which is like sterile superglue for the skin. This will come off on it's own in a couple weeks. It is waterproof and you may bathe normally starting the day after your surgery in a shower. Avoid baths/pools/lakes/oceans until your wounds have fully healed.  ACTIVITIES as tolerated:   Avoid heavy lifting (>10lbs or 1 gallon of milk) for the next 6 weeks. You may resume regular daily activities as tolerated--such as daily self-care, walking, climbing stairs--gradually increasing activities as tolerated.  If you can walk 30 minutes without difficulty, it is safe to try more intense activity such as jogging, treadmill, bicycling, low-impact aerobics.  DO NOT PUSH THROUGH PAIN.  Let pain be your guide: If it hurts to do something, don't do it. You may drive when you are no longer taking prescription pain medication, you can comfortably wear a seatbelt, and you can safely maneuver your car and apply brakes.  FOLLOW UP in our   office Please call [facility] at [phone] to set up an appointment to see your surgeon in the office for a follow-up appointment approximately 2 weeks after your surgery. Make sure that you call for this appointment the day you arrive home to insure a convenient appointment time.  9. If you have disability or family leave forms that need to be completed, you may have  them completed by your primary care physician's office; for return to work instructions, please ask our office staff and they will be happy to assist you in obtaining this documentation   When to call us [phone]: Poor pain control Reactions / problems with new medications (rash/itching, etc)  Fever over 101.5 F (38.5 C) Inability to urinate Nausea/vomiting Worsening swelling or bruising Continued bleeding from incision. Increased pain, redness, or drainage from the incision  The clinic staff is available to answer your questions during regular business hours (8:30am-5pm).  Please don't hesitate to call and ask to speak to one of our nurses for clinical concerns.   A surgeon from [facility] is always on call at the hospitals   If you have a medical emergency, go to the nearest emergency room or call 911.  [facility] [address], [address], [place], [place]  [address] MAIN: [phone] FAX: [phone] [email or web address]  

## 2022-04-18 NOTE — Progress Notes (Signed)
Transition of Care [facility]) Screening Note  Patient Details  Name: Dustin Brown Date of Birth: Apr 13, 1951  Transition of Care [facility]) CM/SW Contact:    Sherie Don, LCSW Phone Number: 04/18/2022, 10:54 AM  Transition of Care Department [facility]) has reviewed patient and no TOC needs have been identified at this time. We will continue to monitor patient advancement through interdisciplinary progression rounds. If new patient transition needs arise, please place a TOC consult.

## 2022-04-18 NOTE — Progress Notes (Signed)
  Subjective No acute events. Doing well. No n/v. Tolerating fair amount of liquids without any issues. Denies distention. No flatus/bm yet.  Objective: Vital signs in last 24 hours: Temp:  [97.5 F (36.4 C)-98.5 F (36.9 C)] 98 F (36.7 C) (05/25 0532) Pulse Rate:  [51-64] 58 (05/25 0532) Resp:  [14-18] 18 (05/25 0532) BP: (139-173)/(68-80) 150/77 (05/25 0532) SpO2:  [94 %-100 %] 100 % (05/25 0532) Weight:  [81.2 kg-83.1 kg] 83.1 kg (05/25 0500) Last BM Date : 04/17/22  Intake/Output from previous day: 05/24 0701 - 05/25 0700 In: 2772.6 [P.O.:840; I.V.:1832.6; IV Piggyback:100] Out: 3510 [Urine:3435; Blood:75] Intake/Output this shift: No intake/output data recorded.  Gen: NAD, comfortable CV: RRR Pulm: Normal work of breathing Abd: Soft, appropriate incisional soreness, nondistended. Incisions c/d/I without erythema or drainage Ext: SCDs in place  Lab Results: CBC  Recent Labs    04/18/22 0429  WBC 7.2  HGB 12.5*  HCT 36.9*  PLT 220   BMET Recent Labs    04/18/22 0429  NA 138  K 4.5  CL 105  CO2 25  GLUCOSE 135*  BUN 6*  CREATININE 0.74  CALCIUM 9.4   PT/INR No results for input(s): LABPROT, INR in the last 72 hours. ABG No results for input(s): PHART, HCO3 in the last 72 hours.  Invalid input(s): PCO2, PO2  Studies/Results:  Anti-infectives: Anti-infectives (From admission, onward)    Start     Dose/Rate Route Frequency Ordered Stop   04/17/22 1400  neomycin (MYCIFRADIN) tablet 1,000 mg  Status:  Discontinued       See Hyperspace for full Linked Orders Report.   1,000 mg Oral 3 times per day 04/17/22 1044 04/17/22 1046   04/17/22 1400  metroNIDAZOLE (FLAGYL) tablet 1,000 mg  Status:  Discontinued       See Hyperspace for full Linked Orders Report.   1,000 mg Oral 3 times per day 04/17/22 1044 04/17/22 1046   04/17/22 1045  cefoTEtan (CEFOTAN) 2 g in sodium chloride 0.9 % 100 mL IVPB        2 g 200 mL/hr over 30 Minutes Intravenous On call to  O.R. 04/17/22 1044 04/17/22 1222        Assessment/Plan: Patient Active Problem List   Diagnosis Date Noted   S/P right hemicolectomy 04/17/2022   OSA on CPAP 07/26/2019   HLD (hyperlipidemia) 06/24/2019   HTN (hypertension) 06/24/2019   Gout 06/24/2019   s/p Procedure(s): LAPAROSCOPIC RIGHT HEMI COLECTOMY OPEN UMBILICAL HERNIA REPAIR 07/04/1750  -Doing great; reviewed procedure, findings and plans/expectations -Advance to soft diet as tolerated -Ambulate 5x/day -Continue Entereg today -D/C IVF -Ppx: SQH, SCDs  LOS: 1 day    Nadeen Landau, MD [facility], [place]

## 2022-04-19 LAB — BASIC METABOLIC PANEL
Anion gap: 8 (ref 5–15)
BUN: 8 mg/dL (ref 8–23)
CO2: 27 mmol/L (ref 22–32)
Calcium: 9.7 mg/dL (ref 8.9–10.3)
Chloride: 102 mmol/L (ref 98–111)
Creatinine, Ser: 0.59 mg/dL — ABNORMAL LOW (ref 0.61–1.24)
GFR, Estimated: >60 mL/min (ref >60)
Glucose, Bld: 121 mg/dL — ABNORMAL HIGH (ref 70–99)
Potassium: 3.4 mmol/L — ABNORMAL LOW (ref 3.5–5.1)
Sodium: 137 mmol/L (ref 135–145)

## 2022-04-19 LAB — CBC
HCT: 41.3 % (ref 39.0–52.0)
Hemoglobin: 14.1 g/dL (ref 13.0–17.0)
MCH: 30.7 pg (ref 26.0–34.0)
MCHC: 34.1 g/dL (ref 30.0–36.0)
MCV: 89.8 fL (ref 80.0–100.0)
Platelets: 241 10*3/uL (ref 150–400)
RBC: 4.6 MIL/uL (ref 4.22–5.81)
RDW: 13.4 % (ref 11.5–15.5)
WBC: 9.2 10*3/uL (ref 4.0–10.5)
nRBC: 0 % (ref 0.0–0.2)

## 2022-04-19 LAB — SURGICAL PATHOLOGY

## 2022-04-19 MED ORDER — POTASSIUM CHLORIDE 2 MEQ/ML IV SOLN
INTRAVENOUS | Status: DC
  Filled 2022-04-19 (×6): qty 1000

## 2022-04-19 NOTE — Progress Notes (Signed)
  Subjective 1 episode of emesis yesterday PM and 1 this morning. Denies nausea presently. Not much appetite. No flatus/bm yet. Denies abdominal distention today. Pain well controlled.  Objective: Vital signs in last 24 hours: Temp:  [98 F (36.7 C)-99.3 F (37.4 C)] 99.3 F (37.4 C) (05/26 0559) Pulse Rate:  [88-92] 92 (05/26 0559) Resp:  [17-18] 18 (05/26 0559) BP: (142-151)/(95) 148/95 (05/26 0559) SpO2:  [97 %-99 %] 97 % (05/26 0559) Last BM Date : 04/18/22  Intake/Output from previous day: 05/25 0701 - 05/26 0700 In: 1560 [P.O.:1560] Out: 1900 [Urine:1600; Emesis/NG output:300] Intake/Output this shift: No intake/output data recorded.  Gen: NAD, comfortable CV: RRR Pulm: Normal work of breathing Abd: Soft, appropriate incisional soreness, remains not significantly distended. Incisions c/d/I without erythema or drainage Ext: SCDs in place  Lab Results: CBC  Recent Labs    04/18/22 0429 04/19/22 0503  WBC 7.2 9.2  HGB 12.5* 14.1  HCT 36.9* 41.3  PLT 220 241   BMET Recent Labs    04/18/22 0429 04/19/22 0503  NA 138 137  K 4.5 3.4*  CL 105 102  CO2 25 27  GLUCOSE 135* 121*  BUN 6* 8  CREATININE 0.74 0.59*  CALCIUM 9.4 9.7   PT/INR No results for input(s): LABPROT, INR in the last 72 hours. ABG No results for input(s): PHART, HCO3 in the last 72 hours.  Invalid input(s): PCO2, PO2  Studies/Results:  Anti-infectives: Anti-infectives (From admission, onward)    Start     Dose/Rate Route Frequency Ordered Stop   04/17/22 1400  neomycin (MYCIFRADIN) tablet 1,000 mg  Status:  Discontinued       See Hyperspace for full Linked Orders Report.   1,000 mg Oral 3 times per day 04/17/22 1044 04/17/22 1046   04/17/22 1400  metroNIDAZOLE (FLAGYL) tablet 1,000 mg  Status:  Discontinued       See Hyperspace for full Linked Orders Report.   1,000 mg Oral 3 times per day 04/17/22 1044 04/17/22 1046   04/17/22 1045  cefoTEtan (CEFOTAN) 2 g in sodium chloride 0.9 %  100 mL IVPB        2 g 200 mL/hr over 30 Minutes Intravenous On call to O.R. 04/17/22 1044 04/17/22 1222        Assessment/Plan: Patient Active Problem List   Diagnosis Date Noted   S/P right hemicolectomy 04/17/2022   OSA on CPAP 07/26/2019   HLD (hyperlipidemia) 06/24/2019   HTN (hypertension) 06/24/2019   Gout 06/24/2019   s/p Procedure(s): LAPAROSCOPIC RIGHT HEMI COLECTOMY OPEN UMBILICAL HERNIA REPAIR 4/78/2956  -Expected postoperative ileus at this juncture; no fever/wbc, monitor -Restart MIVF - will use LR + Kcl given his prediabetes and now mild hypokalemia -De-escalate to clear liquid diet today -Ambulate 5x/day -Continue Entereg  -Ppx: SQH, SCDs  LOS: 2 days   Nadeen Landau, MD [facility], [place]

## 2022-04-20 LAB — CBC WITH DIFFERENTIAL/PLATELET
Abs Immature Granulocytes: 0.03 10*3/uL (ref 0.00–0.07)
Basophils Absolute: 0.1 10*3/uL (ref 0.0–0.1)
Basophils Relative: 1 %
Eosinophils Absolute: 0 10*3/uL (ref 0.0–0.5)
Eosinophils Relative: 0 %
HCT: 39.4 % (ref 39.0–52.0)
Hemoglobin: 13.5 g/dL (ref 13.0–17.0)
Immature Granulocytes: 0 %
Lymphocytes Relative: 20 %
Lymphs Abs: 1.8 10*3/uL (ref 0.7–4.0)
MCH: 30.7 pg (ref 26.0–34.0)
MCHC: 34.3 g/dL (ref 30.0–36.0)
MCV: 89.5 fL (ref 80.0–100.0)
Monocytes Absolute: 1.1 10*3/uL — ABNORMAL HIGH (ref 0.1–1.0)
Monocytes Relative: 13 %
Neutro Abs: 6.1 10*3/uL (ref 1.7–7.7)
Neutrophils Relative %: 66 %
Platelets: 243 10*3/uL (ref 150–400)
RBC: 4.4 MIL/uL (ref 4.22–5.81)
RDW: 13.2 % (ref 11.5–15.5)
WBC: 9.1 10*3/uL (ref 4.0–10.5)
nRBC: 0 % (ref 0.0–0.2)

## 2022-04-20 LAB — BASIC METABOLIC PANEL
Anion gap: 9 (ref 5–15)
BUN: 11 mg/dL (ref 8–23)
CO2: 28 mmol/L (ref 22–32)
Calcium: 9.5 mg/dL (ref 8.9–10.3)
Chloride: 97 mmol/L — ABNORMAL LOW (ref 98–111)
Creatinine, Ser: 0.66 mg/dL (ref 0.61–1.24)
GFR, Estimated: >60 mL/min (ref >60)
Glucose, Bld: 105 mg/dL — ABNORMAL HIGH (ref 70–99)
Potassium: 3.3 mmol/L — ABNORMAL LOW (ref 3.5–5.1)
Sodium: 134 mmol/L — ABNORMAL LOW (ref 135–145)

## 2022-04-20 NOTE — Progress Notes (Signed)
3 Days Post-Op   Subjective/Chief Complaint: Had two BM this morning, but he states that he vomited once this morning No current nausea Passing flatus Minimal pain   Objective: Vital signs in last 24 hours: Temp:  [98.2 F (36.8 C)-100.1 F (37.8 C)] 100.1 F (37.8 C) (05/27 2229) Pulse Rate:  [68-88] 81 (05/27 0632) Resp:  [16-18] 16 (05/27 7989) BP: (141-151)/(86-99) 149/97 (05/27 2119) SpO2:  [100 %] 100 % (05/27 4174) Last BM Date : 04/18/22  Intake/Output from previous day: 05/26 0701 - 05/27 0700 In: 2355.4 [P.O.:600; I.V.:1755.4] Out: 1400 [Urine:1000; Emesis/NG output:400] Intake/Output this shift: No intake/output data recorded.  Gen: NAD, comfortable CV: RRR Pulm: Normal work of breathing Abd: Soft, appropriate incisional soreness, non- distended.  Incisions c/d/I without erythema or drainage Ext: SCDs in place  Lab Results:  Recent Labs    04/19/22 0503 04/20/22 0357  WBC 9.2 9.1  HGB 14.1 13.5  HCT 41.3 39.4  PLT 241 243   BMET Recent Labs    04/19/22 0503 04/20/22 0357  NA 137 134*  K 3.4* 3.3*  CL 102 97*  CO2 27 28  GLUCOSE 121* 105*  BUN 8 11  CREATININE 0.59* 0.66  CALCIUM 9.7 9.5   PT/INR No results for input(s): LABPROT, INR in the last 72 hours. ABG No results for input(s): PHART, HCO3 in the last 72 hours.  Invalid input(s): PCO2, PO2  Studies/Results: No results found.  Anti-infectives: Anti-infectives (From admission, onward)    Start     Dose/Rate Route Frequency Ordered Stop   04/17/22 1400  neomycin (MYCIFRADIN) tablet 1,000 mg  Status:  Discontinued       See Hyperspace for full Linked Orders Report.   1,000 mg Oral 3 times per day 04/17/22 1044 04/17/22 1046   04/17/22 1400  metroNIDAZOLE (FLAGYL) tablet 1,000 mg  Status:  Discontinued       See Hyperspace for full Linked Orders Report.   1,000 mg Oral 3 times per day 04/17/22 1044 04/17/22 1046   04/17/22 1045  cefoTEtan (CEFOTAN) 2 g in sodium chloride 0.9 %  100 mL IVPB        2 g 200 mL/hr over 30 Minutes Intravenous On call to O.R. 04/17/22 1044 04/17/22 1222       Assessment/Plan: s/p Procedure(s): LAPAROSCOPIC RIGHT HEMI COLECTOMY OPEN UMBILICAL HERNIA REPAIR 0/81/4481   -Post-op ileus improving  -Full liquid diet today -Ambulate 5x/day -Continue Entereg  -Ppx: SQH, SCDs  LOS: 3 days    Maia Petties 04/20/2022

## 2022-04-21 LAB — CBC WITH DIFFERENTIAL/PLATELET
Abs Immature Granulocytes: 0.03 10*3/uL (ref 0.00–0.07)
Basophils Absolute: 0.1 10*3/uL (ref 0.0–0.1)
Basophils Relative: 1 %
Eosinophils Absolute: 0.1 10*3/uL (ref 0.0–0.5)
Eosinophils Relative: 1 %
HCT: 39.9 % (ref 39.0–52.0)
Hemoglobin: 13.8 g/dL (ref 13.0–17.0)
Immature Granulocytes: 0 %
Lymphocytes Relative: 25 %
Lymphs Abs: 1.9 10*3/uL (ref 0.7–4.0)
MCH: 31.2 pg (ref 26.0–34.0)
MCHC: 34.6 g/dL (ref 30.0–36.0)
MCV: 90.1 fL (ref 80.0–100.0)
Monocytes Absolute: 1.2 10*3/uL — ABNORMAL HIGH (ref 0.1–1.0)
Monocytes Relative: 16 %
Neutro Abs: 4.4 10*3/uL (ref 1.7–7.7)
Neutrophils Relative %: 57 %
Platelets: 254 10*3/uL (ref 150–400)
RBC: 4.43 MIL/uL (ref 4.22–5.81)
RDW: 13.2 % (ref 11.5–15.5)
WBC: 7.6 10*3/uL (ref 4.0–10.5)
nRBC: 0 % (ref 0.0–0.2)

## 2022-04-21 LAB — BASIC METABOLIC PANEL
Anion gap: 11 (ref 5–15)
BUN: 13 mg/dL (ref 8–23)
CO2: 25 mmol/L (ref 22–32)
Calcium: 9 mg/dL (ref 8.9–10.3)
Chloride: 95 mmol/L — ABNORMAL LOW (ref 98–111)
Creatinine, Ser: 0.73 mg/dL (ref 0.61–1.24)
GFR, Estimated: >60 mL/min (ref >60)
Glucose, Bld: 88 mg/dL (ref 70–99)
Potassium: 2.9 mmol/L — ABNORMAL LOW (ref 3.5–5.1)
Sodium: 131 mmol/L — ABNORMAL LOW (ref 135–145)

## 2022-04-21 MED ORDER — POTASSIUM CHLORIDE 10 MEQ/100ML IV SOLN
10.0000 meq | INTRAVENOUS | Status: AC
  Administered 2022-04-21 (×4): 10 meq via INTRAVENOUS
  Filled 2022-04-21 (×3): qty 100

## 2022-04-21 MED ORDER — POTASSIUM CHLORIDE CRYS ER 20 MEQ PO TBCR
30.0000 meq | EXTENDED_RELEASE_TABLET | Freq: Two times a day (BID) | ORAL | Status: DC
  Administered 2022-04-21: 30 meq via ORAL
  Filled 2022-04-21: qty 1

## 2022-04-21 MED ORDER — POTASSIUM CHLORIDE CRYS ER 15 MEQ PO TBCR
30.0000 meq | EXTENDED_RELEASE_TABLET | Freq: Two times a day (BID) | ORAL | 0 refills | Status: AC
  Filled 2022-04-21: qty 60, 15d supply, fill #0

## 2022-04-21 NOTE — Progress Notes (Addendum)
Pt tolerated sm bites of solid foods, taking better po's and tol well w/o n/v. Reviewed written d/c instructions w pt and all questions answered. He verbalized understanding. D/ C via w/c w all belongings in stable condition.

## 2022-04-21 NOTE — Discharge Summary (Signed)
Physician Discharge Summary  Patient ID: Dustin Brown MRN: [id number] DOB/AGE: 1951-08-25 71 y.o.  Admit date: 04/17/2022 Discharge date: 04/21/2022  Admission Diagnoses: Ascending colon mass And  Patient Active Problem List   Diagnosis Date Noted   S/P right hemicolectomy 04/17/2022   OSA on CPAP 07/26/2019   HLD (hyperlipidemia) 06/24/2019   HTN (hypertension) 06/24/2019   Gout 06/24/2019    Discharge Diagnoses:  Principal Problem:   S/P right hemicolectomy And same as above Also hypokalemia  Discharged Condition: stable  Hospital Course:  Patient was admitted to the floor following lap right colon by Dr. Dema Severin and primary repair of umbilical hernia 2/48/2500.  It took several days for his bowel function to return and he had emesis on POD 3.  This improved and diet was advanced.  His pain was controlled with tramadol and tylenol.  He is dressed and ready to go home on POD 4, however, his K was 2.9.  He was given IV and PO potassium before d/c and was sent home on oral K.  He is ambulating independently, voiding spontaneously.    Consults:  PT and WOCN  Significant Diagnostic Studies: labs: Cr day of d/c 0.73, K 2.9 on day of d/c.  HCT 39.9.   Treatments: surgery: see above and repletion of potassium  Discharge Exam: Blood pressure 119/76, pulse (!) 55, temperature 98.5 F (36.9 C), temperature source Oral, resp. rate 17, height '5\' 6"'$  (1.676 m), weight 83.1 kg, SpO2 98 %. General appearance: alert, cooperative, and no distress Resp: breathing comfortably GI: soft, non distended, incisions c/d/I.  Non tender.   Dressed and ready to go   Disposition: Discharge disposition: 01-Home or Self Care       Discharge Instructions     Call MD for:  difficulty breathing, headache or visual disturbances   Complete by: As directed    Call MD for:  hives   Complete by: As directed    Call MD for:  persistant nausea and vomiting   Complete by: As directed    Call MD for:   redness, tenderness, or signs of infection (pain, swelling, redness, odor or green/yellow discharge around incision site)   Complete by: As directed    Call MD for:  severe uncontrolled pain   Complete by: As directed    Call MD for:  temperature >100.4   Complete by: As directed    Diet - low sodium heart healthy   Complete by: As directed    Increase activity slowly   Complete by: As directed       Allergies as of 04/21/2022   No Known Allergies      Medication List     TAKE these medications    allopurinol 100 MG tablet Commonly known as: ZYLOPRIM Take 100 mg by mouth 2 (two) times daily.   amLODipine 5 MG tablet Commonly known as: NORVASC Take 5 mg by mouth daily.   atorvastatin 10 MG tablet Commonly known as: LIPITOR Take 10 mg by mouth daily.   bimatoprost 0.01 % Soln Commonly known as: LUMIGAN Place 1 drop into both eyes at bedtime.   colchicine 0.6 MG tablet Take 0.6 mg by mouth daily.   hydrochlorothiazide 25 MG tablet Commonly known as: HYDRODIURIL Take 25 mg by mouth daily.   Iron 325 (65 Fe) MG Tabs Take 325 mg by mouth daily.   potassium chloride SA 15 MEQ tablet Commonly known as: KLOR-CON M15 Take 2 tablets (30 mEq total) by mouth 2 (two)  times daily.   traMADol 50 MG tablet Commonly known as: Ultram Take 1 tablet by mouth every 6 hours as needed for up to 5 days (postop pain not controlled with tylenol and ibuprofen first).   UNABLE TO FIND Pt uses a cpap nightly   verapamil 240 MG CR tablet Commonly known as: CALAN-SR Take 240 mg by mouth 2 (two) times daily.   VITAMIN D3 PO Take 1 tablet by mouth daily.        Follow-up Information     Ileana Roup, MD Follow up in 3 week(s).   Specialties: General Surgery, Colon and Rectal Surgery Contact information: [place] [address] [phone]                 Signed: Stark Klein 04/21/2022, 9:36 AM

## 2022-04-21 NOTE — Progress Notes (Signed)
Advanced diet to mech soft but pt is very reluctant to eat solid foods. Stated he didn't want to throw up but denies nausea at this time. Also noted po intake very poor w amber urine in urinal. Enc pt to increase po's and to take more than 2 bites of salmon for his lunch. Cont KCL IV infusions at this time, will cont to monitor.

## 2022-04-23 ENCOUNTER — Other Ambulatory Visit (HOSPITAL_COMMUNITY): Payer: Self-pay

## 2022-04-24 DIAGNOSIS — E785 Hyperlipidemia, unspecified: Secondary | ICD-10-CM | POA: Diagnosis not present

## 2022-04-24 DIAGNOSIS — I10 Essential (primary) hypertension: Secondary | ICD-10-CM | POA: Diagnosis not present

## 2022-06-24 ENCOUNTER — Telehealth: Payer: Self-pay | Admitting: Adult Health

## 2022-06-24 NOTE — Telephone Encounter (Signed)
Pt said CPAP machine will not stay on. Turn on machine, stay on 2 mins. Would like a call from the nurse.  Informed the pt to call DME company.

## 2022-06-24 NOTE — Telephone Encounter (Signed)
Unfortuantley we cannot help with this issue. I have sent a message to [employer] asking them to call the pt and discuss further.

## 2022-07-01 DIAGNOSIS — R7309 Other abnormal glucose: Secondary | ICD-10-CM | POA: Diagnosis not present

## 2022-07-01 DIAGNOSIS — E785 Hyperlipidemia, unspecified: Secondary | ICD-10-CM | POA: Diagnosis not present

## 2022-07-01 DIAGNOSIS — C189 Malignant neoplasm of colon, unspecified: Secondary | ICD-10-CM | POA: Diagnosis not present

## 2022-07-01 DIAGNOSIS — M1000 Idiopathic gout, unspecified site: Secondary | ICD-10-CM | POA: Diagnosis not present

## 2022-07-01 DIAGNOSIS — I10 Essential (primary) hypertension: Secondary | ICD-10-CM | POA: Diagnosis not present

## 2022-07-01 DIAGNOSIS — G4730 Sleep apnea, unspecified: Secondary | ICD-10-CM | POA: Diagnosis not present

## 2022-07-01 DIAGNOSIS — I252 Old myocardial infarction: Secondary | ICD-10-CM | POA: Diagnosis not present

## 2022-07-01 DIAGNOSIS — Z6829 Body mass index (BMI) 29.0-29.9, adult: Secondary | ICD-10-CM | POA: Diagnosis not present

## 2022-08-06 DIAGNOSIS — H25813 Combined forms of age-related cataract, bilateral: Secondary | ICD-10-CM | POA: Diagnosis not present

## 2022-08-06 DIAGNOSIS — H5015 Alternating exotropia: Secondary | ICD-10-CM | POA: Diagnosis not present

## 2022-08-06 DIAGNOSIS — H401131 Primary open-angle glaucoma, bilateral, mild stage: Secondary | ICD-10-CM | POA: Diagnosis not present

## 2022-09-01 DIAGNOSIS — Z23 Encounter for immunization: Secondary | ICD-10-CM | POA: Diagnosis not present

## 2022-09-09 ENCOUNTER — Ambulatory Visit: Payer: Medicare Other | Admitting: Adult Health

## 2022-09-11 NOTE — Progress Notes (Unsigned)
PATIENT: Dustin Brown DOB: 1951-03-07  REASON FOR VISIT: follow up HISTORY FROM: patient  Chief Complaint  Patient presents with   Follow-up    Pt in 19  Pt here CPAP f/u  Pt wants to know if he can try inspire Pt states that his mask leaks  and too tight on face Pt states his machine makes a lot of noise       HISTORY OF PRESENT ILLNESS: Today 09/12/22:  Dustin Brown is a 71 year old male with a history of OSA on CPAP. She returns today for follow-up. Reports that CPAP is working well. Asking about inspire. Mask leaks occasionally but he likes the full face the best. Denies daytime sleepiness. Download is below    09/03/21: Dustin Brown is a 71 y.o. male with a history of OSA on CPAP, he is here today for a follow up. His download below suggest good compliance wearing his CPAP for 100% of the last 30 days. He wears the machine for >4 hours for 87% of that time. His pressure is set at 13 cmH2O with an AHI of 7.6. Mr week states he tried the nasal pillow mask and he felt that it was worse than the full mask and he went back to his other full face mask. Patient request that he remains on this mask, he finds it to be more comfortable and the leak does not bother the patient.    HISTORY  02/20/21: Dustin Brown is a 71 year old male with a history of obstructive sleep apnea on CPAP.  He returns today for follow-up.  He states that his mask continues to leak a night.  He has tried changing out his supplies and making sure straps are tight but it still leaks.  That prohibits him from using it the entire night.  He returns today for follow-up   11/08/19: Dustin Brown is a 71 year old male with a history of obstructive sleep apnea on CPAP.  He returns today for follow-up.  His download indicates that he uses machine 25 out of 30 days for compliance of 83%.  He uses machine greater than 4 hours for 14 days for compliance of 47%.  On average he uses his machine 4 hours and 34 minutes.  His residual  AHI is 5.9 on 13 cm of water with EPR of 1.  His leak in the 95th percentile is 24.3 L/min.  He reports that some nights he has a hard time falling asleep with the mask on.  He also states that sometimes his mask will leak if he does not have the straps tight enough.  He returns today for an evaluation.  REVIEW OF SYSTEMS: Out of a complete 14 system review of symptoms, the patient complains only of the following symptoms, and all other reviewed systems are negative.  FSS: 6 ESS: 2  ALLERGIES: No Known Allergies  HOME MEDICATIONS: Outpatient Medications Prior to Visit  Medication Sig Dispense Refill   allopurinol (ZYLOPRIM) 100 MG tablet Take 100 mg by mouth 2 (two) times daily.     amLODipine (NORVASC) 5 MG tablet Take 5 mg by mouth daily.     atorvastatin (LIPITOR) 10 MG tablet Take 10 mg by mouth daily.     bimatoprost (LUMIGAN) 0.01 % SOLN Place 1 drop into both eyes at bedtime.     Cholecalciferol (VITAMIN D3 PO) Take 1 tablet by mouth daily.     colchicine 0.6 MG tablet Take 0.6 mg by mouth daily.  Ferrous Sulfate (IRON) 325 (65 Fe) MG TABS Take 325 mg by mouth daily.     hydrochlorothiazide (HYDRODIURIL) 25 MG tablet Take 25 mg by mouth daily.     potassium chloride SA (KLOR-CON M15) 15 MEQ tablet Take 2 tablets by mouth 2 times daily. 60 tablet 0   UNABLE TO FIND Pt uses a cpap nightly     verapamil (CALAN-SR) 240 MG CR tablet Take 240 mg by mouth 2 (two) times daily.     No facility-administered medications prior to visit.    PAST MEDICAL HISTORY: Past Medical History:  Diagnosis Date   Anemia    Arthritis    Gout    Gout 06/24/2019   HLD (hyperlipidemia) 06/24/2019   HTN (hypertension) 06/24/2019   Hyperlipidemia    Hypertension     PAST SURGICAL HISTORY: Past Surgical History:  Procedure Laterality Date   CIRCUMCISION     LAPAROSCOPIC RIGHT HEMI COLECTOMY Right 04/17/2022   Procedure: LAPAROSCOPIC RIGHT HEMI COLECTOMY;  Surgeon: Andria Meuse, MD;   Location: [facility];  Service: General;  Laterality: Right;   UMBILICAL HERNIA REPAIR N/A 04/17/2022   Procedure: OPEN UMBILICAL HERNIA REPAIR;  Surgeon: Andria Meuse, MD;  Location: [facility];  Service: General;  Laterality: N/A;    FAMILY HISTORY: Family History  Problem Relation Age of Onset   Heart attack Father    Heart attack Brother        at 82    Sleep apnea Neg Hx     SOCIAL HISTORY: Social History   Socioeconomic History   Marital status: Married    Spouse name: Not on file   Number of children: 3   Years of education: Not on file   Highest education level: Not on file  Occupational History   Not on file  Tobacco Use   Smoking status: Some Days    Types: Cigars   Smokeless tobacco: Never   Tobacco comments:    1 every 4-5 days   Vaping Use   Vaping Use: Never used  Substance and Sexual Activity   Alcohol use: Yes    Alcohol/week: 28.0 standard drinks of alcohol    Types: 28 Shots of liquor per week    Comment: daily   Drug use: Not on file   Sexual activity: Not on file  Other Topics Concern   Not on file  Social History Narrative   Not on file   Social Determinants of Health   Financial Resource Strain: Not on file  Food Insecurity: Not on file  Transportation Needs: Not on file  Physical Activity: Not on file  Stress: Not on file  Social Connections: Not on file  Intimate Partner Violence: Not on file      PHYSICAL EXAM  Vitals:   09/12/22 1322  BP: 139/70  Pulse: 61  Weight: 188 lb 3.2 oz (85.4 kg)  Height: 5\' 6"  (1.676 m)   Body mass index is 30.38 kg/m.  Generalized: Well developed, in no acute distress  Chest: Lungs clear to auscultation bilaterally  Neurological examination  Mentation: Alert oriented to time, place, history taking. Follows all commands speech and language fluent Cranial nerve II-XII: Extraocular movements were full, visual field were full on confrontational test Head turning and shoulder shrug  were normal  and symmetric.ory testing is intact to soft touch on all 4 extremities. No evidence of extinction is noted.  Gait and station: Gait is normal.    DIAGNOSTIC DATA (LABS, IMAGING, TESTING) -  I reviewed patient records, labs, notes, testing and imaging myself where available.  Lab Results  Component Value Date   WBC 7.6 04/21/2022   HGB 13.8 04/21/2022   HCT 39.9 04/21/2022   MCV 90.1 04/21/2022   PLT 254 04/21/2022      Component Value Date/Time   NA 131 (L) 04/21/2022 0401   K 2.9 (L) 04/21/2022 0401   CL 95 (L) 04/21/2022 0401   CO2 25 04/21/2022 0401   GLUCOSE 88 04/21/2022 0401   BUN 13 04/21/2022 0401   CREATININE 0.73 04/21/2022 0401   CALCIUM 9.0 04/21/2022 0401   PROT 7.2 04/08/2022 0830   ALBUMIN 4.2 04/08/2022 0830   AST 27 04/08/2022 0830   ALT 29 04/08/2022 0830   ALKPHOS 83 04/08/2022 0830   BILITOT 0.6 04/08/2022 0830   GFRNONAA >60 04/21/2022 0401      ASSESSMENT AND PLAN 71 y.o. year old male  has a past medical history of Anemia, Arthritis, Gout, Gout (06/24/2019), HLD (hyperlipidemia) (06/24/2019), HTN (hypertension) (06/24/2019), Hyperlipidemia, and Hypertension. here with:  OSA on CPAP  - CPAP compliance excellent - Fair treatment of AHI most likely due to leak - Encourage patient to use CPAP nightly and > 4 hours each night - F/U in 1 year or sooner if needed   Butch Penny, MSN, NP-C 09/12/2022, 1:41 PM [facility] [address], [address] [place], [place] [address] [phone]

## 2022-09-12 ENCOUNTER — Ambulatory Visit (INDEPENDENT_AMBULATORY_CARE_PROVIDER_SITE_OTHER): Payer: Medicare Other | Admitting: Adult Health

## 2022-09-12 ENCOUNTER — Encounter: Payer: Self-pay | Admitting: Adult Health

## 2022-09-12 VITALS — BP 139/70 | HR 61 | Ht 66.0 in | Wt 188.2 lb

## 2022-09-12 DIAGNOSIS — G4733 Obstructive sleep apnea (adult) (pediatric): Secondary | ICD-10-CM

## 2022-09-12 NOTE — Patient Instructions (Signed)
Continue using CPAP nightly and greater than 4 hours each night °If your symptoms worsen or you develop new symptoms please let us know.  ° °

## 2022-10-07 DIAGNOSIS — N401 Enlarged prostate with lower urinary tract symptoms: Secondary | ICD-10-CM | POA: Diagnosis not present

## 2022-10-14 DIAGNOSIS — R3121 Asymptomatic microscopic hematuria: Secondary | ICD-10-CM | POA: Diagnosis not present

## 2022-10-14 DIAGNOSIS — N401 Enlarged prostate with lower urinary tract symptoms: Secondary | ICD-10-CM | POA: Diagnosis not present

## 2022-10-14 DIAGNOSIS — R351 Nocturia: Secondary | ICD-10-CM | POA: Diagnosis not present

## 2022-10-14 DIAGNOSIS — N281 Cyst of kidney, acquired: Secondary | ICD-10-CM | POA: Diagnosis not present

## 2022-10-14 DIAGNOSIS — N5201 Erectile dysfunction due to arterial insufficiency: Secondary | ICD-10-CM | POA: Diagnosis not present

## 2022-11-04 DIAGNOSIS — R7309 Other abnormal glucose: Secondary | ICD-10-CM | POA: Diagnosis not present

## 2022-11-04 DIAGNOSIS — I10 Essential (primary) hypertension: Secondary | ICD-10-CM | POA: Diagnosis not present

## 2022-11-04 DIAGNOSIS — E782 Mixed hyperlipidemia: Secondary | ICD-10-CM | POA: Diagnosis not present

## 2022-11-04 DIAGNOSIS — K429 Umbilical hernia without obstruction or gangrene: Secondary | ICD-10-CM | POA: Diagnosis not present

## 2022-11-04 DIAGNOSIS — Z91199 Patient's noncompliance with other medical treatment and regimen due to unspecified reason: Secondary | ICD-10-CM | POA: Diagnosis not present

## 2022-11-04 DIAGNOSIS — E291 Testicular hypofunction: Secondary | ICD-10-CM | POA: Diagnosis not present

## 2022-11-04 DIAGNOSIS — R635 Abnormal weight gain: Secondary | ICD-10-CM | POA: Diagnosis not present

## 2022-11-04 DIAGNOSIS — G4730 Sleep apnea, unspecified: Secondary | ICD-10-CM | POA: Diagnosis not present

## 2022-11-05 DIAGNOSIS — H25813 Combined forms of age-related cataract, bilateral: Secondary | ICD-10-CM | POA: Diagnosis not present

## 2022-11-05 DIAGNOSIS — H5015 Alternating exotropia: Secondary | ICD-10-CM | POA: Diagnosis not present

## 2022-11-05 DIAGNOSIS — H401131 Primary open-angle glaucoma, bilateral, mild stage: Secondary | ICD-10-CM | POA: Diagnosis not present

## 2023-01-06 DIAGNOSIS — R3121 Asymptomatic microscopic hematuria: Secondary | ICD-10-CM | POA: Diagnosis not present

## 2023-01-06 DIAGNOSIS — N5201 Erectile dysfunction due to arterial insufficiency: Secondary | ICD-10-CM | POA: Diagnosis not present

## 2023-03-06 DIAGNOSIS — R635 Abnormal weight gain: Secondary | ICD-10-CM | POA: Diagnosis not present

## 2023-03-06 DIAGNOSIS — G4730 Sleep apnea, unspecified: Secondary | ICD-10-CM | POA: Diagnosis not present

## 2023-03-06 DIAGNOSIS — I10 Essential (primary) hypertension: Secondary | ICD-10-CM | POA: Diagnosis not present

## 2023-03-06 DIAGNOSIS — E782 Mixed hyperlipidemia: Secondary | ICD-10-CM | POA: Diagnosis not present

## 2023-03-06 DIAGNOSIS — E291 Testicular hypofunction: Secondary | ICD-10-CM | POA: Diagnosis not present

## 2023-03-06 DIAGNOSIS — R7309 Other abnormal glucose: Secondary | ICD-10-CM | POA: Diagnosis not present

## 2023-03-10 DIAGNOSIS — E785 Hyperlipidemia, unspecified: Secondary | ICD-10-CM | POA: Diagnosis not present

## 2023-03-10 DIAGNOSIS — R7309 Other abnormal glucose: Secondary | ICD-10-CM | POA: Diagnosis not present

## 2023-03-10 DIAGNOSIS — E7800 Pure hypercholesterolemia, unspecified: Secondary | ICD-10-CM | POA: Diagnosis not present

## 2023-03-10 DIAGNOSIS — G4730 Sleep apnea, unspecified: Secondary | ICD-10-CM | POA: Diagnosis not present

## 2023-03-10 DIAGNOSIS — I10 Essential (primary) hypertension: Secondary | ICD-10-CM | POA: Diagnosis not present

## 2023-03-17 DIAGNOSIS — Z23 Encounter for immunization: Secondary | ICD-10-CM | POA: Diagnosis not present

## 2023-03-18 DIAGNOSIS — H25813 Combined forms of age-related cataract, bilateral: Secondary | ICD-10-CM | POA: Diagnosis not present

## 2023-03-18 DIAGNOSIS — H401131 Primary open-angle glaucoma, bilateral, mild stage: Secondary | ICD-10-CM | POA: Diagnosis not present

## 2023-03-18 DIAGNOSIS — H5015 Alternating exotropia: Secondary | ICD-10-CM | POA: Diagnosis not present

## 2023-04-29 DIAGNOSIS — D128 Benign neoplasm of rectum: Secondary | ICD-10-CM | POA: Diagnosis not present

## 2023-04-29 DIAGNOSIS — Z86010 Personal history of colonic polyps: Secondary | ICD-10-CM | POA: Diagnosis not present

## 2023-04-29 DIAGNOSIS — Z09 Encounter for follow-up examination after completed treatment for conditions other than malignant neoplasm: Secondary | ICD-10-CM | POA: Diagnosis not present

## 2023-04-29 DIAGNOSIS — K5730 Diverticulosis of large intestine without perforation or abscess without bleeding: Secondary | ICD-10-CM | POA: Diagnosis not present

## 2023-04-29 DIAGNOSIS — D123 Benign neoplasm of transverse colon: Secondary | ICD-10-CM | POA: Diagnosis not present

## 2023-04-29 DIAGNOSIS — Z980 Intestinal bypass and anastomosis status: Secondary | ICD-10-CM | POA: Diagnosis not present

## 2023-04-29 DIAGNOSIS — D122 Benign neoplasm of ascending colon: Secondary | ICD-10-CM | POA: Diagnosis not present

## 2023-05-01 DIAGNOSIS — D122 Benign neoplasm of ascending colon: Secondary | ICD-10-CM | POA: Diagnosis not present

## 2023-05-01 DIAGNOSIS — D128 Benign neoplasm of rectum: Secondary | ICD-10-CM | POA: Diagnosis not present

## 2023-05-01 DIAGNOSIS — D123 Benign neoplasm of transverse colon: Secondary | ICD-10-CM | POA: Diagnosis not present

## 2023-06-24 DIAGNOSIS — H401131 Primary open-angle glaucoma, bilateral, mild stage: Secondary | ICD-10-CM | POA: Diagnosis not present

## 2023-06-24 DIAGNOSIS — H5015 Alternating exotropia: Secondary | ICD-10-CM | POA: Diagnosis not present

## 2023-06-24 DIAGNOSIS — H25813 Combined forms of age-related cataract, bilateral: Secondary | ICD-10-CM | POA: Diagnosis not present

## 2023-07-14 DIAGNOSIS — C189 Malignant neoplasm of colon, unspecified: Secondary | ICD-10-CM | POA: Diagnosis not present

## 2023-07-14 DIAGNOSIS — R7309 Other abnormal glucose: Secondary | ICD-10-CM | POA: Diagnosis not present

## 2023-07-14 DIAGNOSIS — G4730 Sleep apnea, unspecified: Secondary | ICD-10-CM | POA: Diagnosis not present

## 2023-07-14 DIAGNOSIS — E785 Hyperlipidemia, unspecified: Secondary | ICD-10-CM | POA: Diagnosis not present

## 2023-07-14 DIAGNOSIS — I10 Essential (primary) hypertension: Secondary | ICD-10-CM | POA: Diagnosis not present

## 2023-08-24 DIAGNOSIS — Z23 Encounter for immunization: Secondary | ICD-10-CM | POA: Diagnosis not present

## 2023-09-11 NOTE — Progress Notes (Unsigned)
PATIENT: Dustin Brown DOB: 18-Jul-1951  REASON FOR VISIT: follow up HISTORY FROM: patient  No chief complaint on file.     HISTORY OF PRESENT ILLNESS: Today 09/14/23:  Dustin Brown is a 72 y.o. male with a history of OSA on CPAP. Returns today for follow-up.      09/12/22: Dustin Brown is a 72 year old male with a history of OSA on CPAP. She returns today for follow-up. Reports that CPAP is working well. Asking about inspire. Mask leaks occasionally but he likes the full face the best. Denies daytime sleepiness. Download is below    09/03/21: Dustin Brown is a 72 y.o. male with a history of OSA on CPAP, he is here today for a follow up. His download below suggest good compliance wearing his CPAP for 100% of the last 30 days. He wears the machine for >4 hours for 87% of that time. His pressure is set at 13 cmH2O with an AHI of 7.6. [name] states he tried the nasal pillow mask and he felt that it was worse than the full mask and he went back to his other full face mask. Patient request that he remains on this mask, he finds it to be more comfortable and the leak does not bother the patient.    HISTORY  02/20/21: Dustin Brown is a 72 year old male with a history of obstructive sleep apnea on CPAP.  He returns today for follow-up.  He states that his mask continues to leak a night.  He has tried changing out his supplies and making sure straps are tight but it still leaks.  That prohibits him from using it the entire night.  He returns today for follow-up   11/08/19: Dustin Brown is a 72 year old male with a history of obstructive sleep apnea on CPAP.  He returns today for follow-up.  His download indicates that he uses machine 25 out of 30 days for compliance of 83%.  He uses machine greater than 4 hours for 14 days for compliance of 47%.  On average he uses his machine 4 hours and 34 minutes.  His residual AHI is 5.9 on 13 cm of water with EPR of 1.  His leak in the 95th percentile is  24.3 L/min.  He reports that some nights he has a hard time falling asleep with the mask on.  He also states that sometimes his mask will leak if he does not have the straps tight enough.  He returns today for an evaluation.  REVIEW OF SYSTEMS: Out of a complete 14 system review of symptoms, the patient complains only of the following symptoms, and all other reviewed systems are negative.  FSS: 6 ESS: 2  ALLERGIES: No Known Allergies  HOME MEDICATIONS: Outpatient Medications Prior to Visit  Medication Sig Dispense Refill   allopurinol (ZYLOPRIM) 100 MG tablet Take 100 mg by mouth 2 (two) times daily.     amLODipine (NORVASC) 5 MG tablet Take 5 mg by mouth daily.     atorvastatin (LIPITOR) 10 MG tablet Take 10 mg by mouth daily.     bimatoprost (LUMIGAN) 0.01 % SOLN Place 1 drop into both eyes at bedtime.     Cholecalciferol (VITAMIN D3 PO) Take 1 tablet by mouth daily.     colchicine 0.6 MG tablet Take 0.6 mg by mouth daily.     Ferrous Sulfate (IRON) 325 (65 Fe) MG TABS Take 325 mg by mouth daily.     hydrochlorothiazide (HYDRODIURIL) 25 MG tablet  Take 25 mg by mouth daily.     potassium chloride SA (KLOR-CON M15) 15 MEQ tablet Take 2 tablets by mouth 2 times daily. 60 tablet 0   UNABLE TO FIND Pt uses a cpap nightly     verapamil (CALAN-SR) 240 MG CR tablet Take 240 mg by mouth 2 (two) times daily.     No facility-administered medications prior to visit.    PAST MEDICAL HISTORY: Past Medical History:  Diagnosis Date   Anemia    Arthritis    Gout    Gout 06/24/2019   HLD (hyperlipidemia) 06/24/2019   HTN (hypertension) 06/24/2019   Hyperlipidemia    Hypertension     PAST SURGICAL HISTORY: Past Surgical History:  Procedure Laterality Date   CIRCUMCISION     LAPAROSCOPIC RIGHT HEMI COLECTOMY Right 04/17/2022   Procedure: LAPAROSCOPIC RIGHT HEMI COLECTOMY;  Surgeon: Andria Meuse, MD;  Location: [facility];  Service: General;  Laterality: Right;   UMBILICAL HERNIA  REPAIR N/A 04/17/2022   Procedure: OPEN UMBILICAL HERNIA REPAIR;  Surgeon: Andria Meuse, MD;  Location: [facility];  Service: General;  Laterality: N/A;    FAMILY HISTORY: Family History  Problem Relation Age of Onset   Heart attack Father    Heart attack Brother        at 14    Sleep apnea Neg Hx     SOCIAL HISTORY: Social History   Socioeconomic History   Marital status: Married    Spouse name: Not on file   Number of children: 3   Years of education: Not on file   Highest education level: Not on file  Occupational History   Not on file  Tobacco Use   Smoking status: Some Days    Types: Cigars   Smokeless tobacco: Never   Tobacco comments:    1 every 4-5 days   Vaping Use   Vaping status: Never Used  Substance and Sexual Activity   Alcohol use: Yes    Alcohol/week: 28.0 standard drinks of alcohol    Types: 28 Shots of liquor per week    Comment: daily   Drug use: Not on file   Sexual activity: Not on file  Other Topics Concern   Not on file  Social History Narrative   Not on file   Social Determinants of Health   Financial Resource Strain: Not on file  Food Insecurity: Not on file  Transportation Needs: Not on file  Physical Activity: Not on file  Stress: Not on file  Social Connections: Not on file  Intimate Partner Violence: Not on file      PHYSICAL EXAM  There were no vitals filed for this visit.  There is no height or weight on file to calculate BMI.  Generalized: Well developed, in no acute distress  Chest: Lungs clear to auscultation bilaterally  Neurological examination  Mentation: Alert oriented to time, place, history taking. Follows all commands speech and language fluent Cranial nerve II-XII: Extraocular movements were full, visual field were full on confrontational test Head turning and shoulder shrug  were normal and symmetric.ory testing is intact to soft touch on all 4 extremities. No evidence of extinction is noted.  Gait and  station: Gait is normal.    DIAGNOSTIC DATA (LABS, IMAGING, TESTING) - I reviewed patient records, labs, notes, testing and imaging myself where available.  Lab Results  Component Value Date   WBC 7.6 04/21/2022   HGB 13.8 04/21/2022   HCT 39.9 04/21/2022  MCV 90.1 04/21/2022   PLT 254 04/21/2022      Component Value Date/Time   NA 131 (L) 04/21/2022 0401   K 2.9 (L) 04/21/2022 0401   CL 95 (L) 04/21/2022 0401   CO2 25 04/21/2022 0401   GLUCOSE 88 04/21/2022 0401   BUN 13 04/21/2022 0401   CREATININE 0.73 04/21/2022 0401   CALCIUM 9.0 04/21/2022 0401   PROT 7.2 04/08/2022 0830   ALBUMIN 4.2 04/08/2022 0830   AST 27 04/08/2022 0830   ALT 29 04/08/2022 0830   ALKPHOS 83 04/08/2022 0830   BILITOT 0.6 04/08/2022 0830   GFRNONAA >60 04/21/2022 0401      ASSESSMENT AND PLAN 71 y.o. year old male  has a past medical history of Anemia, Arthritis, Gout, Gout (06/24/2019), HLD (hyperlipidemia) (06/24/2019), HTN (hypertension) (06/24/2019), Hyperlipidemia, and Hypertension. here with:  OSA on CPAP  - CPAP compliance excellent - Fair treatment of AHI most likely due to leak - Encourage patient to use CPAP nightly and > 4 hours each night - F/U in 1 year or sooner if needed   Butch Penny, MSN, NP-C 09/14/2023, 1:59 PM [facility] [address], [address] [place], [place] [address] [phone]

## 2023-09-15 ENCOUNTER — Ambulatory Visit (INDEPENDENT_AMBULATORY_CARE_PROVIDER_SITE_OTHER): Payer: Medicare Other | Admitting: Adult Health

## 2023-09-15 ENCOUNTER — Encounter: Payer: Self-pay | Admitting: Adult Health

## 2023-09-15 VITALS — BP 146/69 | HR 51 | Ht 66.0 in | Wt 190.0 lb

## 2023-09-15 DIAGNOSIS — G4733 Obstructive sleep apnea (adult) (pediatric): Secondary | ICD-10-CM

## 2023-09-15 NOTE — Patient Instructions (Addendum)
Continue using CPAP nightly and greater than 4 hours each night Mask refitting ordered- do without dentures in Call in 30-45 days and I will review your CPAP download. If your symptoms worsen or you develop new symptoms please let us know.

## 2023-10-29 DIAGNOSIS — N5201 Erectile dysfunction due to arterial insufficiency: Secondary | ICD-10-CM | POA: Diagnosis not present

## 2023-10-29 DIAGNOSIS — R3121 Asymptomatic microscopic hematuria: Secondary | ICD-10-CM | POA: Diagnosis not present

## 2023-11-06 DIAGNOSIS — G4730 Sleep apnea, unspecified: Secondary | ICD-10-CM | POA: Diagnosis not present

## 2023-11-06 DIAGNOSIS — R7309 Other abnormal glucose: Secondary | ICD-10-CM | POA: Diagnosis not present

## 2023-11-06 DIAGNOSIS — E785 Hyperlipidemia, unspecified: Secondary | ICD-10-CM | POA: Diagnosis not present

## 2023-11-06 DIAGNOSIS — I10 Essential (primary) hypertension: Secondary | ICD-10-CM | POA: Diagnosis not present

## 2023-11-06 DIAGNOSIS — E559 Vitamin D deficiency, unspecified: Secondary | ICD-10-CM | POA: Diagnosis not present

## 2023-11-06 DIAGNOSIS — C189 Malignant neoplasm of colon, unspecified: Secondary | ICD-10-CM | POA: Diagnosis not present

## 2023-11-10 DIAGNOSIS — E782 Mixed hyperlipidemia: Secondary | ICD-10-CM | POA: Diagnosis not present

## 2023-11-10 DIAGNOSIS — E291 Testicular hypofunction: Secondary | ICD-10-CM | POA: Diagnosis not present

## 2023-11-10 DIAGNOSIS — I10 Essential (primary) hypertension: Secondary | ICD-10-CM | POA: Diagnosis not present

## 2023-11-10 DIAGNOSIS — M1000 Idiopathic gout, unspecified site: Secondary | ICD-10-CM | POA: Diagnosis not present

## 2023-11-10 DIAGNOSIS — E785 Hyperlipidemia, unspecified: Secondary | ICD-10-CM | POA: Diagnosis not present

## 2023-11-10 DIAGNOSIS — Z0000 Encounter for general adult medical examination without abnormal findings: Secondary | ICD-10-CM | POA: Diagnosis not present

## 2023-11-10 DIAGNOSIS — R7309 Other abnormal glucose: Secondary | ICD-10-CM | POA: Diagnosis not present

## 2023-11-10 DIAGNOSIS — G4730 Sleep apnea, unspecified: Secondary | ICD-10-CM | POA: Diagnosis not present

## 2023-11-23 DIAGNOSIS — Z23 Encounter for immunization: Secondary | ICD-10-CM | POA: Diagnosis not present

## 2023-12-02 DIAGNOSIS — H401131 Primary open-angle glaucoma, bilateral, mild stage: Secondary | ICD-10-CM | POA: Diagnosis not present

## 2023-12-02 DIAGNOSIS — H25813 Combined forms of age-related cataract, bilateral: Secondary | ICD-10-CM | POA: Diagnosis not present

## 2023-12-02 DIAGNOSIS — H5015 Alternating exotropia: Secondary | ICD-10-CM | POA: Diagnosis not present

## 2023-12-19 DIAGNOSIS — M549 Dorsalgia, unspecified: Secondary | ICD-10-CM | POA: Diagnosis not present

## 2023-12-19 DIAGNOSIS — I10 Essential (primary) hypertension: Secondary | ICD-10-CM | POA: Diagnosis not present

## 2023-12-19 DIAGNOSIS — E785 Hyperlipidemia, unspecified: Secondary | ICD-10-CM | POA: Diagnosis not present

## 2023-12-22 ENCOUNTER — Other Ambulatory Visit: Payer: Self-pay | Admitting: Family Medicine

## 2023-12-22 ENCOUNTER — Ambulatory Visit
Admission: RE | Admit: 2023-12-22 | Discharge: 2023-12-22 | Disposition: A | Discharge status: Home / Self Care | Payer: Medicare Other | Source: Ambulatory Visit | Attending: Family Medicine | Admitting: Family Medicine

## 2023-12-22 DIAGNOSIS — M549 Dorsalgia, unspecified: Secondary | ICD-10-CM

## 2023-12-22 DIAGNOSIS — M545 Low back pain, unspecified: Secondary | ICD-10-CM | POA: Diagnosis not present

## 2024-01-12 DIAGNOSIS — I10 Essential (primary) hypertension: Secondary | ICD-10-CM | POA: Diagnosis not present

## 2024-01-12 DIAGNOSIS — G4730 Sleep apnea, unspecified: Secondary | ICD-10-CM | POA: Diagnosis not present

## 2024-01-12 DIAGNOSIS — M130 Polyarthritis, unspecified: Secondary | ICD-10-CM | POA: Diagnosis not present

## 2024-01-12 DIAGNOSIS — M549 Dorsalgia, unspecified: Secondary | ICD-10-CM | POA: Diagnosis not present

## 2024-03-17 DIAGNOSIS — G4730 Sleep apnea, unspecified: Secondary | ICD-10-CM | POA: Diagnosis not present

## 2024-03-17 DIAGNOSIS — E782 Mixed hyperlipidemia: Secondary | ICD-10-CM | POA: Diagnosis not present

## 2024-03-17 DIAGNOSIS — I10 Essential (primary) hypertension: Secondary | ICD-10-CM | POA: Diagnosis not present

## 2024-04-18 DIAGNOSIS — Z23 Encounter for immunization: Secondary | ICD-10-CM | POA: Diagnosis not present

## 2024-06-01 DIAGNOSIS — H43813 Vitreous degeneration, bilateral: Secondary | ICD-10-CM | POA: Diagnosis not present

## 2024-06-01 DIAGNOSIS — H5015 Alternating exotropia: Secondary | ICD-10-CM | POA: Diagnosis not present

## 2024-06-01 DIAGNOSIS — H401131 Primary open-angle glaucoma, bilateral, mild stage: Secondary | ICD-10-CM | POA: Diagnosis not present

## 2024-06-01 DIAGNOSIS — H25813 Combined forms of age-related cataract, bilateral: Secondary | ICD-10-CM | POA: Diagnosis not present

## 2024-07-15 DIAGNOSIS — I10 Essential (primary) hypertension: Secondary | ICD-10-CM | POA: Diagnosis not present

## 2024-07-15 DIAGNOSIS — M130 Polyarthritis, unspecified: Secondary | ICD-10-CM | POA: Diagnosis not present

## 2024-07-15 DIAGNOSIS — M549 Dorsalgia, unspecified: Secondary | ICD-10-CM | POA: Diagnosis not present

## 2024-07-15 DIAGNOSIS — E782 Mixed hyperlipidemia: Secondary | ICD-10-CM | POA: Diagnosis not present

## 2024-07-15 DIAGNOSIS — E1169 Type 2 diabetes mellitus with other specified complication: Secondary | ICD-10-CM | POA: Diagnosis not present

## 2024-07-15 DIAGNOSIS — G4730 Sleep apnea, unspecified: Secondary | ICD-10-CM | POA: Diagnosis not present

## 2024-07-19 DIAGNOSIS — G4730 Sleep apnea, unspecified: Secondary | ICD-10-CM | POA: Diagnosis not present

## 2024-07-19 DIAGNOSIS — Z9989 Dependence on other enabling machines and devices: Secondary | ICD-10-CM | POA: Diagnosis not present

## 2024-07-19 DIAGNOSIS — E782 Mixed hyperlipidemia: Secondary | ICD-10-CM | POA: Diagnosis not present

## 2024-07-19 DIAGNOSIS — I10 Essential (primary) hypertension: Secondary | ICD-10-CM | POA: Diagnosis not present

## 2024-07-19 DIAGNOSIS — E785 Hyperlipidemia, unspecified: Secondary | ICD-10-CM | POA: Diagnosis not present

## 2024-07-19 DIAGNOSIS — R7309 Other abnormal glucose: Secondary | ICD-10-CM | POA: Diagnosis not present

## 2024-07-28 DIAGNOSIS — E559 Vitamin D deficiency, unspecified: Secondary | ICD-10-CM | POA: Diagnosis not present

## 2024-08-08 DIAGNOSIS — Z23 Encounter for immunization: Secondary | ICD-10-CM | POA: Diagnosis not present

## 2024-09-20 ENCOUNTER — Encounter: Payer: Self-pay | Admitting: Adult Health

## 2024-09-20 ENCOUNTER — Ambulatory Visit: Payer: Medicare Other | Admitting: Adult Health

## 2024-09-20 VITALS — BP 137/75 | HR 62 | Ht 66.0 in | Wt 188.0 lb

## 2024-09-20 DIAGNOSIS — G4733 Obstructive sleep apnea (adult) (pediatric): Secondary | ICD-10-CM | POA: Diagnosis not present

## 2024-09-20 NOTE — Patient Instructions (Signed)
 Continue using CPAP nightly and greater than 4 hours each night If your symptoms worsen or you develop new symptoms please let us  know.

## 2024-09-20 NOTE — Progress Notes (Signed)
 PATIENT: Dustin Brown DOB: 1951-02-03  REASON FOR VISIT: follow up HISTORY FROM: patient  Chief Complaint  Patient presents with   Follow-up    Pt in 4 alone Pt here for cpap f/u Pt states no changes since last office visit    HISTORY OF PRESENT ILLNESS: Today 09/20/24:  Dustin Brown is a 73 y.o. male with a history of OSA on CPAP. Returns today for follow-up.  Reports that CPAP is working well.  He does have a fullface mask and does feel it leaking.  His last sleep study was in 2019.  His download is below     09/15/23: Dustin Brown is a 73 y.o. male with a history of OSA on CPAP. Returns today for follow-up.  He reports that his CPAP is working well although he does state that he does feel the mask leaking.  He states that he does not wear his dentures at night-not sure that he has the proper fitting.  His download is below     09/12/22: Dustin Brown is a 73 year old male with a history of OSA on CPAP. She returns today for follow-up. Reports that CPAP is working well. Asking about inspire. Mask leaks occasionally but he likes the full Brown the best. Denies daytime sleepiness. Download is below    09/03/21: Dustin Brown is a 73 y.o. male with a history of OSA on CPAP, he is here today for a follow up. His download below suggest good compliance wearing his CPAP for 100% of the last 30 days. He wears the machine for >4 hours for 87% of that time. His pressure is set at 13 cmH2O with an AHI of 7.6. Dustin Brown states he tried the nasal pillow mask and he felt that it was worse than the full mask and he went back to his other full Brown mask. Patient request that he remains on this mask, he finds it to be more comfortable and the leak does not bother the patient.    HISTORY  02/20/21: Dustin Brown is a 73 year old male with a history of obstructive sleep apnea on CPAP.  He returns today for follow-up.  He states that his mask continues to leak a night.  He has tried changing out his  supplies and making sure straps are tight but it still leaks.  That prohibits him from using it the entire night.  He returns today for follow-up   11/08/19: Dustin Brown is a 73 year old male with a history of obstructive sleep apnea on CPAP.  He returns today for follow-up.  His download indicates that he uses machine 25 out of 30 days for compliance of 83%.  He uses machine greater than 4 hours for 14 days for compliance of 47%.  On average he uses his machine 4 hours and 34 minutes.  His residual AHI is 5.9 on 13 cm of water with EPR of 1.  His leak in the 95th percentile is 24.3 L/min.  He reports that some nights he has a hard time falling asleep with the mask on.  He also states that sometimes his mask will leak if he does not have the straps tight enough.  He returns today for an evaluation.  REVIEW OF SYSTEMS: Out of a complete 14 system review of symptoms, the patient complains only of the following symptoms, and all other reviewed systems are negative.  ESS: 2  ALLERGIES: No Known Allergies  HOME MEDICATIONS: Outpatient Medications Prior to Visit  Medication Sig Dispense  Refill   allopurinol  (ZYLOPRIM ) 100 MG tablet Take 100 mg by mouth 2 (two) times daily.     amLODipine  (NORVASC ) 5 MG tablet Take 5 mg by mouth daily.     atorvastatin  (LIPITOR) 10 MG tablet Take 10 mg by mouth daily.     bimatoprost (LUMIGAN) 0.01 % SOLN Place 1 drop into both eyes at bedtime.     Cholecalciferol (VITAMIN D3 PO) Take 1 tablet by mouth daily.     colchicine  0.6 MG tablet Take 0.6 mg by mouth daily.     Ferrous Sulfate  (IRON) 325 (65 Fe) MG TABS Take 325 mg by mouth daily.     hydrochlorothiazide  (HYDRODIURIL ) 25 MG tablet Take 25 mg by mouth daily.     potassium chloride  SA (KLOR-CON  M15) 15 MEQ tablet Take 2 tablets by mouth 2 times daily. (Patient taking differently: Take 30 mEq by mouth daily.) 60 tablet 0   UNABLE TO FIND Pt uses a cpap nightly     verapamil  (CALAN -SR) 240 MG CR tablet Take 240  mg by mouth 2 (two) times daily.     No facility-administered medications prior to visit.    PAST MEDICAL HISTORY: Past Medical History:  Diagnosis Date   Anemia    Arthritis    Gout    Gout 06/24/2019   HLD (hyperlipidemia) 06/24/2019   HTN (hypertension) 06/24/2019   Hyperlipidemia    Hypertension     PAST SURGICAL HISTORY: Past Surgical History:  Procedure Laterality Date   CIRCUMCISION     LAPAROSCOPIC RIGHT HEMI COLECTOMY Right 04/17/2022   Procedure: LAPAROSCOPIC RIGHT HEMI COLECTOMY;  Surgeon: Teresa Lonni HERO, MD;  Location: [facility];  Service: General;  Laterality: Right;   UMBILICAL HERNIA REPAIR N/A 04/17/2022   Procedure: OPEN UMBILICAL HERNIA REPAIR;  Surgeon: Teresa Lonni HERO, MD;  Location: [facility];  Service: General;  Laterality: N/A;    FAMILY HISTORY: Family History  Problem Relation Age of Onset   Heart attack Father    Heart attack Brother        at 20    Sleep apnea Neg Hx     SOCIAL HISTORY: Social History   Socioeconomic History   Marital status: Married    Spouse name: Not on file   Number of children: 3   Years of education: Not on file   Highest education level: Not on file  Occupational History   Not on file  Tobacco Use   Smoking status: Some Days    Types: Cigars   Smokeless tobacco: Never   Tobacco comments:    1 every 4-5 days   Vaping Use   Vaping status: Never Used  Substance and Sexual Activity   Alcohol use: Yes    Alcohol/week: 16.0 standard drinks of alcohol    Types: 16 Shots of liquor per week    Comment: daily   Drug use: Not on file   Sexual activity: Not on file  Other Topics Concern   Not on file  Social History Narrative   Pt lives with family    Pt works    Social Drivers of Corporate Investment Banker Strain: Not on file  Food Insecurity: Not on file  Transportation Needs: Not on file  Physical Activity: Not on file  Stress: Not on file  Social Connections: Not on file  Intimate Partner  Violence: Not on file      PHYSICAL EXAM  Vitals:   09/20/24 0948  BP: 137/75  Pulse: 62  Weight: 188 lb (85.3 kg)  Height: 5' 6 (1.676 m)     Body mass index is 30.34 kg/m.  Generalized: Well developed, in no acute distress  Chest: Lungs clear to auscultation bilaterally  Neurological examination  Mentation: Alert oriented to time, place, history taking. Follows all commands speech and language fluent Cranial nerve II-XII: Facial symmetry noted   DIAGNOSTIC DATA (LABS, IMAGING, TESTING) - I reviewed patient records, labs, notes, testing and imaging myself where available.  Lab Results  Component Value Date   WBC 7.6 04/21/2022   HGB 13.8 04/21/2022   HCT 39.9 04/21/2022   MCV 90.1 04/21/2022   PLT 254 04/21/2022      Component Value Date/Time   NA 131 (L) 04/21/2022 0401   K 2.9 (L) 04/21/2022 0401   CL 95 (L) 04/21/2022 0401   CO2 25 04/21/2022 0401   GLUCOSE 88 04/21/2022 0401   BUN 13 04/21/2022 0401   CREATININE 0.73 04/21/2022 0401   CALCIUM  9.0 04/21/2022 0401   PROT 7.2 04/08/2022 0830   ALBUMIN 4.2 04/08/2022 0830   AST 27 04/08/2022 0830   ALT 29 04/08/2022 0830   ALKPHOS 83 04/08/2022 0830   BILITOT 0.6 04/08/2022 0830   GFRNONAA >60 04/21/2022 0401      ASSESSMENT AND PLAN 73 y.o. year old male  has a past medical history of Anemia, Arthritis, Gout, Gout (06/24/2019), HLD (hyperlipidemia) (06/24/2019), HTN (hypertension) (06/24/2019), Hyperlipidemia, and Hypertension. here with:  OSA on CPAP  - CPAP compliance satisfactory -Mask refitting ordered.   - Encourage patient to use CPAP nightly and > 4 hours each night -Reviewed previous sleep study with the patient.  Did advise that he does qualify for new machine however he deferred for now. - F/U in 1 year or sooner if needed   Duwaine Russell, MSN, NP-C 09/20/2024, 10:08 AM [facility] [address], [address] [place], [place] [address] [phone]  The patient's  condition requires frequent monitoring and adjustments in the treatment plan, reflecting the ongoing complexity of care.  This provider is the continuing focal point for all needed services for this condition.

## 2024-09-21 NOTE — Progress Notes (Signed)
 Community message has been sent to [employer] for supplies on 09/21/24. DD

## 2024-11-01 DIAGNOSIS — Z87448 Personal history of other diseases of urinary system: Secondary | ICD-10-CM | POA: Diagnosis not present

## 2024-11-01 DIAGNOSIS — N5201 Erectile dysfunction due to arterial insufficiency: Secondary | ICD-10-CM | POA: Diagnosis not present

## 2024-11-01 DIAGNOSIS — R351 Nocturia: Secondary | ICD-10-CM | POA: Diagnosis not present

## 2024-11-01 DIAGNOSIS — N3941 Urge incontinence: Secondary | ICD-10-CM | POA: Diagnosis not present

## 2025-09-26 ENCOUNTER — Ambulatory Visit: Admitting: Adult Health
